# Patient Record
Sex: Male | Born: 1965
Health system: Southern US, Community
[De-identification: ages and names within clinical notes are randomized; demographics above are authoritative.]

## PROBLEM LIST (undated history)

## (undated) DIAGNOSIS — F329 Major depressive disorder, single episode, unspecified: Secondary | ICD-10-CM

## (undated) DIAGNOSIS — R079 Chest pain, unspecified: Secondary | ICD-10-CM

## (undated) DIAGNOSIS — I10 Essential (primary) hypertension: Secondary | ICD-10-CM

## (undated) DIAGNOSIS — K589 Irritable bowel syndrome without diarrhea: Secondary | ICD-10-CM

## (undated) DIAGNOSIS — R0609 Other forms of dyspnea: Secondary | ICD-10-CM

## (undated) DIAGNOSIS — J45909 Unspecified asthma, uncomplicated: Secondary | ICD-10-CM

## (undated) DIAGNOSIS — E785 Hyperlipidemia, unspecified: Secondary | ICD-10-CM

## (undated) DIAGNOSIS — E781 Pure hyperglyceridemia: Secondary | ICD-10-CM

## (undated) DIAGNOSIS — F445 Conversion disorder with seizures or convulsions: Secondary | ICD-10-CM

## (undated) DIAGNOSIS — G43909 Migraine, unspecified, not intractable, without status migrainosus: Secondary | ICD-10-CM

## (undated) DIAGNOSIS — F32A Depression, unspecified: Secondary | ICD-10-CM

## (undated) DIAGNOSIS — K5900 Constipation, unspecified: Secondary | ICD-10-CM

## (undated) DIAGNOSIS — E669 Obesity, unspecified: Secondary | ICD-10-CM

## (undated) DIAGNOSIS — M109 Gout, unspecified: Secondary | ICD-10-CM

## (undated) DIAGNOSIS — R0600 Dyspnea, unspecified: Secondary | ICD-10-CM

## (undated) DIAGNOSIS — Z8619 Personal history of other infectious and parasitic diseases: Secondary | ICD-10-CM

## (undated) DIAGNOSIS — F259 Schizoaffective disorder, unspecified: Secondary | ICD-10-CM

## (undated) DIAGNOSIS — Z8739 Personal history of other diseases of the musculoskeletal system and connective tissue: Secondary | ICD-10-CM

## (undated) HISTORY — DX: Personal history of other infectious and parasitic diseases: Z86.19

## (undated) HISTORY — DX: Dyspnea, unspecified: R06.00

## (undated) HISTORY — DX: Gout, unspecified: M10.9

## (undated) HISTORY — DX: Depression, unspecified: F32.A

## (undated) HISTORY — DX: Migraine, unspecified, not intractable, without status migrainosus: G43.909

## (undated) HISTORY — DX: Personal history of other diseases of the musculoskeletal system and connective tissue: Z87.39

## (undated) HISTORY — DX: Constipation, unspecified: K59.00

## (undated) HISTORY — DX: Obesity, unspecified: E66.9

## (undated) HISTORY — DX: Hyperlipidemia, unspecified: E78.5

## (undated) HISTORY — DX: Conversion disorder with seizures or convulsions: F44.5

## (undated) HISTORY — PX: UMBILICAL HERNIA REPAIR: SHX196

## (undated) HISTORY — DX: Chest pain, unspecified: R07.9

## (undated) HISTORY — DX: Schizoaffective disorder, unspecified: F25.9

## (undated) HISTORY — DX: Other forms of dyspnea: R06.09

## (undated) HISTORY — DX: Essential (primary) hypertension: I10

## (undated) HISTORY — DX: Pure hyperglyceridemia: E78.1

## (undated) HISTORY — DX: Irritable bowel syndrome without diarrhea: K58.9

---
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
## 2009-05-08 ENCOUNTER — Emergency Department (HOSPITAL_BASED_OUTPATIENT_CLINIC_OR_DEPARTMENT_OTHER): Admission: EM | Admit: 2009-05-08 | Discharge: 2009-05-08 | Payer: Self-pay | Admitting: Emergency Medicine

## 2011-03-06 ENCOUNTER — Other Ambulatory Visit: Payer: Self-pay | Admitting: Diagnostic Neuroimaging

## 2011-03-06 DIAGNOSIS — Z139 Encounter for screening, unspecified: Secondary | ICD-10-CM

## 2011-03-06 DIAGNOSIS — R413 Other amnesia: Secondary | ICD-10-CM

## 2011-03-06 DIAGNOSIS — R569 Unspecified convulsions: Secondary | ICD-10-CM

## 2011-03-10 ENCOUNTER — Ambulatory Visit
Admission: RE | Admit: 2011-03-10 | Discharge: 2011-03-10 | Disposition: A | Discharge status: Home / Self Care | Payer: Medicaid Other | Source: Ambulatory Visit | Attending: Diagnostic Neuroimaging | Admitting: Diagnostic Neuroimaging

## 2011-03-10 DIAGNOSIS — R413 Other amnesia: Secondary | ICD-10-CM

## 2011-03-10 DIAGNOSIS — Z139 Encounter for screening, unspecified: Secondary | ICD-10-CM

## 2011-03-10 DIAGNOSIS — R569 Unspecified convulsions: Secondary | ICD-10-CM

## 2011-03-10 MED ORDER — GADOBENATE DIMEGLUMINE 529 MG/ML IV SOLN
20.0000 mL | Freq: Once | INTRAVENOUS | Status: AC | PRN
  Administered 2011-03-10: 20 mL via INTRAVENOUS

## 2014-08-07 DIAGNOSIS — E669 Obesity, unspecified: Secondary | ICD-10-CM | POA: Diagnosis not present

## 2014-08-07 DIAGNOSIS — I10 Essential (primary) hypertension: Secondary | ICD-10-CM | POA: Diagnosis not present

## 2014-08-07 DIAGNOSIS — E785 Hyperlipidemia, unspecified: Secondary | ICD-10-CM | POA: Diagnosis not present

## 2014-08-07 DIAGNOSIS — F205 Residual schizophrenia: Secondary | ICD-10-CM | POA: Diagnosis not present

## 2014-10-01 DIAGNOSIS — F251 Schizoaffective disorder, depressive type: Secondary | ICD-10-CM | POA: Diagnosis not present

## 2015-01-21 DIAGNOSIS — F251 Schizoaffective disorder, depressive type: Secondary | ICD-10-CM | POA: Diagnosis not present

## 2015-05-03 DIAGNOSIS — R799 Abnormal finding of blood chemistry, unspecified: Secondary | ICD-10-CM | POA: Diagnosis not present

## 2015-05-03 DIAGNOSIS — F209 Schizophrenia, unspecified: Secondary | ICD-10-CM | POA: Diagnosis not present

## 2015-05-03 DIAGNOSIS — I10 Essential (primary) hypertension: Secondary | ICD-10-CM | POA: Diagnosis not present

## 2015-05-03 DIAGNOSIS — R5383 Other fatigue: Secondary | ICD-10-CM | POA: Diagnosis not present

## 2015-05-03 DIAGNOSIS — E785 Hyperlipidemia, unspecified: Secondary | ICD-10-CM | POA: Diagnosis not present

## 2015-05-03 DIAGNOSIS — E669 Obesity, unspecified: Secondary | ICD-10-CM | POA: Diagnosis not present

## 2015-05-22 DIAGNOSIS — F251 Schizoaffective disorder, depressive type: Secondary | ICD-10-CM | POA: Diagnosis not present

## 2015-09-11 DIAGNOSIS — F251 Schizoaffective disorder, depressive type: Secondary | ICD-10-CM | POA: Diagnosis not present

## 2015-12-30 DIAGNOSIS — F251 Schizoaffective disorder, depressive type: Secondary | ICD-10-CM | POA: Diagnosis not present

## 2016-02-05 DIAGNOSIS — E785 Hyperlipidemia, unspecified: Secondary | ICD-10-CM | POA: Diagnosis not present

## 2016-02-05 DIAGNOSIS — I10 Essential (primary) hypertension: Secondary | ICD-10-CM | POA: Diagnosis not present

## 2016-02-05 DIAGNOSIS — M549 Dorsalgia, unspecified: Secondary | ICD-10-CM | POA: Diagnosis not present

## 2016-03-25 DIAGNOSIS — Z23 Encounter for immunization: Secondary | ICD-10-CM | POA: Diagnosis not present

## 2016-03-25 DIAGNOSIS — S61230A Puncture wound without foreign body of right index finger without damage to nail, initial encounter: Secondary | ICD-10-CM | POA: Diagnosis not present

## 2016-04-08 DIAGNOSIS — I10 Essential (primary) hypertension: Secondary | ICD-10-CM | POA: Diagnosis not present

## 2016-04-08 DIAGNOSIS — Z125 Encounter for screening for malignant neoplasm of prostate: Secondary | ICD-10-CM | POA: Diagnosis not present

## 2016-04-08 DIAGNOSIS — R350 Frequency of micturition: Secondary | ICD-10-CM | POA: Diagnosis not present

## 2016-04-08 DIAGNOSIS — K047 Periapical abscess without sinus: Secondary | ICD-10-CM | POA: Diagnosis not present

## 2016-04-08 DIAGNOSIS — E785 Hyperlipidemia, unspecified: Secondary | ICD-10-CM | POA: Diagnosis not present

## 2016-04-08 DIAGNOSIS — Z23 Encounter for immunization: Secondary | ICD-10-CM | POA: Diagnosis not present

## 2016-04-20 DIAGNOSIS — F251 Schizoaffective disorder, depressive type: Secondary | ICD-10-CM | POA: Diagnosis not present

## 2016-06-04 DIAGNOSIS — M109 Gout, unspecified: Secondary | ICD-10-CM | POA: Diagnosis not present

## 2016-08-13 DIAGNOSIS — H52223 Regular astigmatism, bilateral: Secondary | ICD-10-CM | POA: Diagnosis not present

## 2016-09-29 DIAGNOSIS — I10 Essential (primary) hypertension: Secondary | ICD-10-CM | POA: Diagnosis not present

## 2016-09-29 DIAGNOSIS — R0609 Other forms of dyspnea: Secondary | ICD-10-CM | POA: Diagnosis not present

## 2016-09-29 DIAGNOSIS — E785 Hyperlipidemia, unspecified: Secondary | ICD-10-CM | POA: Diagnosis not present

## 2016-10-01 ENCOUNTER — Telehealth: Payer: Self-pay | Admitting: *Deleted

## 2016-10-01 NOTE — Telephone Encounter (Signed)
Notes sent to scheduling.   

## 2016-10-02 ENCOUNTER — Telehealth: Payer: Self-pay | Admitting: Cardiology

## 2016-10-02 NOTE — Telephone Encounter (Signed)
Received records from [facility] for appointment on 10/05/16 with Boyce Medici, PA-C.  Records put with Brittany's schedule for 10/05/16. lp

## 2016-10-05 ENCOUNTER — Ambulatory Visit (INDEPENDENT_AMBULATORY_CARE_PROVIDER_SITE_OTHER): Payer: PPO | Admitting: Cardiology

## 2016-10-05 ENCOUNTER — Encounter: Payer: Self-pay | Admitting: Cardiology

## 2016-10-05 VITALS — BP 132/88 | HR 79 | Ht 67.5 in | Wt 241.2 lb

## 2016-10-05 DIAGNOSIS — R0602 Shortness of breath: Secondary | ICD-10-CM | POA: Diagnosis not present

## 2016-10-05 DIAGNOSIS — R0609 Other forms of dyspnea: Secondary | ICD-10-CM | POA: Diagnosis not present

## 2016-10-05 DIAGNOSIS — R079 Chest pain, unspecified: Secondary | ICD-10-CM | POA: Diagnosis not present

## 2016-10-05 LAB — CBC WITH DIFFERENTIAL/PLATELET
Basophils Absolute: 77 cells/uL (ref 0–200)
Basophils Relative: 1 %
Eosinophils Absolute: 154 cells/uL (ref 15–500)
Eosinophils Relative: 2 %
HCT: 45.2 % (ref 38.5–50.0)
Hemoglobin: 15.3 g/dL (ref 13.2–17.1)
Lymphocytes Relative: 38 %
Lymphs Abs: 2926 cells/uL (ref 850–3900)
MCH: 29.1 pg (ref 27.0–33.0)
MCHC: 33.8 g/dL (ref 32.0–36.0)
MCV: 85.9 fL (ref 80.0–100.0)
MPV: 11.3 fL (ref 7.5–12.5)
Monocytes Absolute: 924 cells/uL (ref 200–950)
Monocytes Relative: 12 %
Neutro Abs: 3619 cells/uL (ref 1500–7800)
Neutrophils Relative %: 47 %
Platelets: 237 10*3/uL (ref 140–400)
RBC: 5.26 MIL/uL (ref 4.20–5.80)
RDW: 14 % (ref 11.0–15.0)
WBC: 7.7 10*3/uL (ref 3.8–10.8)

## 2016-10-05 NOTE — Patient Instructions (Addendum)
Schedule Lexiscan Myoview   Schedule Echocardiogram   Lab today ( cbc,bnp )    Your physician recommends that you schedule a follow-up appointment in: 2 weeks with extender on Dr.Crenshaw's team

## 2016-10-05 NOTE — Progress Notes (Signed)
10/05/2016 Carl Flynn   1965-09-05  [id number]  Primary Physician [facility] Primary Cardiologist: New (Dr. Jens Som, DOD)   Reason for Visit/CC: New Patient Evaluation for Dyspnea Referring MD: Lovenia Kim, PA-C, [facility]  HPI:  Carl Flynn is a 51 y.o. male who is being seen today, as a new patient, for the evaluation of exertional dyspnea at the request of Lovenia Kim, PA-C.  His cardiac risk factors include hypertension, hyperlipidemia/ hypertriglyceridemia and obesity.   For the last 6 months he has experience exertional dyspnea that occurs with minimal activity such as getting clothes out of the dryer, mopping the floors and climbing stairs. His symptoms resolve with rest. He also notes increased fatigue. He notes occasional CP, although not related to his dyspnea. Can occur at rest and feels like indigestion, although not related with meals.  Recent laboratory work at PCP office included a lipid panel as well as a comprehensive metabolic panel. Lipid panel showed elevated LDL at 124 mg/dL. Triglycerides were severely elevated at 316 mg/dL. HDL was 31. Comprehensive metabolic panel showed no normal serum creatinine at 1.09. Potassium normal at 4.0. Hepatic function also normal. No CBC was obtained.   EKG today demonstrates NSR. No ischemia. He is currently asymptomatic at rest. BP is 132/88.    Current Meds  Medication Sig  . lisinopril-hydrochlorothiazide (PRINZIDE,ZESTORETIC) 20-12.5 MG tablet Take by mouth daily.   . naproxen (NAPROSYN) 500 MG tablet Take by mouth daily.   . propranolol (INDERAL) 10 MG tablet Take by mouth 2 (two) times daily.   . simvastatin (ZOCOR) 20 MG tablet Take by mouth daily.   Marland Kitchen thiothixene (NAVANE) 2 MG capsule Take 2 mg by mouth daily.   Marland Kitchen topiramate (TOPAMAX) 100 MG tablet Take 100 mg by mouth daily.   . traZODone (DESYREL) 100 MG tablet Take 100 mg by mouth daily.   Marland Kitchen venlafaxine XR (EFFEXOR-XR) 150 MG 24  hr capsule Take 150 mg by mouth daily.    Allergies  Allergen Reactions  . Hydrocodone    Past Medical History:  Diagnosis Date  . Chest pain at rest   . Exertional dyspnea   . HLD (hyperlipidemia)   . HTN (hypertension)   . Hypertriglyceridemia   . Obesity    Family History  Problem Relation Age of Onset  . CAD Mother 45    CABG x 4   Past Surgical History:  Procedure Laterality Date  . UMBILICAL HERNIA REPAIR     Social History   Social History  . Marital status: Married    Spouse name: N/A  . Number of children: N/A  . Years of education: N/A   Occupational History  . Not on file.   Social History Main Topics  . Smoking status: Not on file  . Smokeless tobacco: Not on file  . Alcohol use Not on file  . Drug use: Unknown  . Sexual activity: Not on file   Other Topics Concern  . Not on file   Social History Narrative  . No narrative on file     Review of Systems: General: negative for chills, fever, night sweats or weight changes.  Cardiovascular: negative for chest pain, dyspnea on exertion, edema, orthopnea, palpitations, paroxysmal nocturnal dyspnea or shortness of breath Dermatological: negative for rash Respiratory: negative for cough or wheezing Urologic: negative for hematuria Abdominal: negative for nausea, vomiting, diarrhea, bright red blood per rectum, melena, or hematemesis Neurologic: negative for visual changes, syncope, or dizziness All  other systems reviewed and are otherwise negative except as noted above.   Physical Exam:  Blood pressure 132/88, pulse 79, height 5' 7.5" (1.715 m), weight 241 lb 3.2 oz (109.4 kg).  General appearance: alert, cooperative, no distress and moderately obese Neck: no carotid bruit and no JVD Lungs: clear to auscultation bilaterally Heart: regular rate and rhythm, S1, S2 normal, no murmur, click, rub or gallop Extremities: extremities normal, atraumatic, no cyanosis or edema Pulses: 2+ and  symmetric Skin: Skin color, texture, turgor normal. No rashes or lesions Neurologic: Grossly normal  EKG NSR. No ischemia  -- personally reviewed   ASSESSMENT AND PLAN:   1. Exertional Dyspnea/ Intermittent Chest Pain at Rest: EKG shows NSR w/o ischemia. I've discussed plan with Dr. Jens Som, DOD. Given his symptoms and risk factors, we will order a 2D Echo and Lexiscan NST to assess for structural heart disease/ r/o HF and coronary ischemia. We will also check a CBC today to r/o anemia. His PCP is managing his risk factors, which include HTN and HLD. F/u with APP on Dr. Ludwig Clarks team in 1-2 weeks.     Carl Flynn Delmer Islam, M[facility] 10/05/2016 2:40 PM

## 2016-10-06 LAB — BRAIN NATRIURETIC PEPTIDE: Brain Natriuretic Peptide: <4 pg/mL (ref <100)

## 2016-10-20 ENCOUNTER — Other Ambulatory Visit (HOSPITAL_COMMUNITY): Payer: PPO

## 2016-10-21 ENCOUNTER — Telehealth (HOSPITAL_COMMUNITY): Payer: Self-pay

## 2016-10-21 NOTE — Telephone Encounter (Signed)
Encounter complete. 

## 2016-10-23 ENCOUNTER — Ambulatory Visit (HOSPITAL_COMMUNITY)
Admission: RE | Admit: 2016-10-23 | Payer: PPO | Source: Ambulatory Visit | Attending: Cardiology | Admitting: Cardiology

## 2016-10-30 ENCOUNTER — Ambulatory Visit: Payer: PPO | Admitting: Cardiology

## 2016-11-02 DIAGNOSIS — J9801 Acute bronchospasm: Secondary | ICD-10-CM | POA: Diagnosis not present

## 2016-11-04 ENCOUNTER — Telehealth (HOSPITAL_COMMUNITY): Payer: Self-pay | Admitting: Cardiology

## 2016-11-04 NOTE — Telephone Encounter (Signed)
Called pt and spoke with him about rescheduling his echo and myoview . And he voiced that he was no completely over cold and is still having some breathing problems. He just rescheduled echo at this time.

## 2016-11-05 ENCOUNTER — Ambulatory Visit (HOSPITAL_COMMUNITY): Payer: PPO | Attending: Cardiology

## 2016-11-05 ENCOUNTER — Other Ambulatory Visit: Payer: Self-pay

## 2016-11-05 DIAGNOSIS — E785 Hyperlipidemia, unspecified: Secondary | ICD-10-CM | POA: Diagnosis not present

## 2016-11-05 DIAGNOSIS — R0602 Shortness of breath: Secondary | ICD-10-CM

## 2016-11-05 DIAGNOSIS — E669 Obesity, unspecified: Secondary | ICD-10-CM | POA: Diagnosis not present

## 2016-11-05 DIAGNOSIS — Z6837 Body mass index (BMI) 37.0-37.9, adult: Secondary | ICD-10-CM | POA: Diagnosis not present

## 2016-11-05 DIAGNOSIS — I10 Essential (primary) hypertension: Secondary | ICD-10-CM | POA: Insufficient documentation

## 2016-11-05 DIAGNOSIS — I340 Nonrheumatic mitral (valve) insufficiency: Secondary | ICD-10-CM | POA: Diagnosis not present

## 2016-11-05 DIAGNOSIS — I358 Other nonrheumatic aortic valve disorders: Secondary | ICD-10-CM | POA: Diagnosis not present

## 2016-12-04 DIAGNOSIS — F251 Schizoaffective disorder, depressive type: Secondary | ICD-10-CM | POA: Diagnosis not present

## 2016-12-09 ENCOUNTER — Telehealth (HOSPITAL_COMMUNITY): Payer: Self-pay | Admitting: Cardiology

## 2016-12-10 NOTE — Telephone Encounter (Signed)
12/09/2016 10:08 AM Phone (Outgoing) [name], Carl Flynn (Self) [phone] (H)   Completed - Called pt and spoke with him and he voiced that he was in the middle of something and wanted someone to call him back.     By Elita BooneGriffin, Jakelin Taussig A

## 2016-12-29 ENCOUNTER — Telehealth (HOSPITAL_COMMUNITY): Payer: Self-pay | Admitting: *Deleted

## 2016-12-29 NOTE — Telephone Encounter (Signed)
Patient given detailed instructions per Myocardial Perfusion Study Information Sheet for the test on 01/01/17 at 1100. Patient notified to arrive 15 minutes early and that it is imperative to arrive on time for appointment to keep from having the test rescheduled.  If you need to cancel or reschedule your appointment, please call the office within 24 hours of your appointment. . Patient verbalized understanding.Carl Flynn, Adelene IdlerCynthia W

## 2017-01-01 ENCOUNTER — Ambulatory Visit (HOSPITAL_COMMUNITY): Payer: PPO | Attending: Cardiovascular Disease

## 2017-01-01 DIAGNOSIS — R0602 Shortness of breath: Secondary | ICD-10-CM | POA: Insufficient documentation

## 2017-01-01 LAB — MYOCARDIAL PERFUSION IMAGING
CHL CUP RESTING HR STRESS: 66 {beats}/min
LV sys vol: 57 mL
LVDIAVOL: 123 mL (ref 62–150)
NUC STRESS TID: 0.97
Peak HR: 100 {beats}/min
RATE: 0.37
SDS: 1
SRS: 6
SSS: 7

## 2017-01-01 MED ORDER — REGADENOSON 0.4 MG/5ML IV SOLN
0.4000 mg | Freq: Once | INTRAVENOUS | Status: AC
  Administered 2017-01-01: 0.4 mg via INTRAVENOUS

## 2017-01-01 MED ORDER — TECHNETIUM TC 99M TETROFOSMIN IV KIT
32.5000 | PACK | Freq: Once | INTRAVENOUS | Status: AC | PRN
  Administered 2017-01-01: 32.5 via INTRAVENOUS
  Filled 2017-01-01: qty 33

## 2017-01-01 MED ORDER — TECHNETIUM TC 99M TETROFOSMIN IV KIT
10.5000 | PACK | Freq: Once | INTRAVENOUS | Status: AC | PRN
  Administered 2017-01-01: 10.5 via INTRAVENOUS
  Filled 2017-01-01: qty 11

## 2017-01-05 ENCOUNTER — Inpatient Hospital Stay (HOSPITAL_COMMUNITY): Admission: RE | Admit: 2017-01-05 | Payer: PPO | Source: Ambulatory Visit

## 2017-01-18 ENCOUNTER — Telehealth: Payer: Self-pay | Admitting: Cardiology

## 2017-01-18 NOTE — Telephone Encounter (Signed)
Notes recorded by Jeanann Lewandowsky, RMA on 01/04/2017 at 9:10 AM EDT Mickel Baas, Please verify f/u appt? Pt was supposed to be seen in May. If test is normal, does he need f/u appt? ------  Notes recorded by Isaiah Serge, NP on 01/04/2017 at 8:11 AM EDT Pt was seen at [facility], Nuc stress test looks good, low risk, but he was supposed to follow up with Dr. Stanford Breed or APP next week. Please arrange. Thank you.  Notified the pt of his nuclear stress test per Cecilie Kicks NP.  Pt verbalized understanding, but wanted to ask Mickel Baas or Dr. Stanford Breed where he should go from here?  Pt states that he is still having the same symptoms of DOE, and becoming easily fatigued. Pt would like to schedule a follow-up appt with either Mickel Baas or Dr. Stanford Breed.  Pt states he was wanting to see Dr. Stanford Breed if preferable, for he is assigned to the pt, and he has never met him.  Informed the pt that I will route this request to Cecilie Kicks, Dr. Stanford Breed, and covering RN for further review and follow-up with the pt.  Pt verbalized understanding and agrees with this plan.

## 2017-01-18 NOTE — Telephone Encounter (Signed)
New Message  Pt call requesting to speak with RN about stress test tests. Please call back to discuss

## 2017-01-19 NOTE — Telephone Encounter (Signed)
Yes pt needs follow up with Dr. Jens Somrenshaw or his APP.  Please arrange

## 2017-01-19 NOTE — Telephone Encounter (Signed)
Will route this message to Dr. Ludwig Clarksrenshaw's scheduler Cleotis NipperLashonda Shobowale, to follow-up with the pt, and schedule and appt with Dr. Jens Somrenshaw or an APP on his team.

## 2017-01-23 NOTE — Telephone Encounter (Signed)
Schedule fuov Brian Crenshaw  

## 2017-01-26 NOTE — Telephone Encounter (Signed)
Follow up scheduled 02-13-17.

## 2017-02-03 ENCOUNTER — Ambulatory Visit (INDEPENDENT_AMBULATORY_CARE_PROVIDER_SITE_OTHER): Payer: PPO | Admitting: Physician Assistant

## 2017-02-03 ENCOUNTER — Encounter: Payer: Self-pay | Admitting: Physician Assistant

## 2017-02-03 VITALS — BP 116/74 | HR 70 | Ht 67.0 in | Wt 253.0 lb

## 2017-02-03 DIAGNOSIS — R0789 Other chest pain: Secondary | ICD-10-CM | POA: Diagnosis not present

## 2017-02-03 DIAGNOSIS — R0609 Other forms of dyspnea: Secondary | ICD-10-CM

## 2017-02-03 DIAGNOSIS — I10 Essential (primary) hypertension: Secondary | ICD-10-CM

## 2017-02-03 DIAGNOSIS — E785 Hyperlipidemia, unspecified: Secondary | ICD-10-CM | POA: Diagnosis not present

## 2017-02-03 NOTE — Patient Instructions (Signed)
Medication Instructions:   No changes  Labwork:   None  Testing/Procedures:  None  Follow-Up:  With Dr. Jens Somrenshaw in 3 months   If you need a refill on your cardiac medications before your next appointment, please call your pharmacy.

## 2017-02-03 NOTE — Progress Notes (Signed)
Cardiology Office Note    Date:  02/05/2017   ID:  Carl Flynn, DOB 03/21/1966, MRN [id number]  PCP:  Trey SailorsPa, [facility]  Cardiologist:  Dr. Jens Somrenshaw (pending 1st visit with Dr. Jens Somrenshaw)  Chief Complaint  Patient presents with  . Follow-up    2 weeks. Atypical chest pain and DOE    History of Present Illness:  Carl Flynn is a 51 y.o. male with PMH of HTN, HLD and obesity. He was seen as a new patient by [employer] CorporationBrittainey Simmon on 10/05/2016. Apparently he had exertional dyspnea for 6 month prior to the last visit with minimal activity. Last lipid panel showed LDL 124 and triglycerides 782216. Echocardiogram obtained on 11/05/2016 showed EF 50-55%, mild MR, increased thickening of the atrial septum consistent with lipomatous hypertrophy. He eventually underwent Myoview on 01/01/2017 which showed EF 54%, probable normal perfusion and mild soft tissue attenuation, no significant ischemia or scar. He was supposed to follow-up back in May, however due to the delay in stress test, his follow-up was also delayed.  He recently contact cardiology service due to persistent symptom. He presents today still complaining of dyspnea on exertion. He has a bad back and bad knees and does not do any exertional type activity. He will occasionally have some chest tightness, but always occurs at rest and never with exertion. It only last a few seconds at a time before going away. I don't think his chest discomfort is cardiac in nature. Given recent normal echocardiogram and normal Myoview, I would recommend increased exercise. Given the bad back and bad knee, he probably will benefit from water aerobics. At this point I do not recommend any additional workup.    Past Medical History:  Diagnosis Date  . Chest pain at rest   . Exertional dyspnea   . HLD (hyperlipidemia)   . HTN (hypertension)   . Hypertriglyceridemia   . Obesity     Past Surgical History:  Procedure Laterality Date  .  UMBILICAL HERNIA REPAIR      Current Medications: Outpatient Medications Prior to Visit  Medication Sig Dispense Refill  . lisinopril-hydrochlorothiazide (PRINZIDE,ZESTORETIC) 20-12.5 MG tablet Take by mouth daily.     . naproxen (NAPROSYN) 500 MG tablet Take by mouth daily.     . propranolol (INDERAL) 10 MG tablet Take by mouth 2 (two) times daily.     . simvastatin (ZOCOR) 20 MG tablet Take by mouth daily.     Marland Kitchen. thiothixene (NAVANE) 2 MG capsule Take 2 mg by mouth daily.     Marland Kitchen. topiramate (TOPAMAX) 100 MG tablet Take 100 mg by mouth daily.     . traZODone (DESYREL) 100 MG tablet Take 100 mg by mouth daily.     Marland Kitchen. venlafaxine XR (EFFEXOR-XR) 150 MG 24 hr capsule Take 150 mg by mouth daily.      No facility-administered medications prior to visit.      Allergies:   Hydrocodone   Social History   Social History  . Marital status: Married    Spouse name: N/A  . Number of children: N/A  . Years of education: N/A   Social History Main Topics  . Smoking status: Former Games developermoker  . Smokeless tobacco: Former NeurosurgeonUser  . Alcohol use None  . Drug use: Unknown  . Sexual activity: Not Asked   Other Topics Concern  . None   Social History Narrative  . None     Family History:  The patient's family history includes CAD (age  of onset: 65) in his mother.   ROS:   Please see the history of present illness.    ROS All other systems reviewed and are negative.   PHYSICAL EXAM:   VS:  BP 116/74   Pulse 70   Ht 5\' 7"  (1.702 m)   Wt 253 lb (114.8 kg)   BMI 39.63 kg/m    GEN: Well nourished, well developed, in no acute distress  HEENT: normal  Neck: no JVD, carotid bruits, or masses Cardiac: RRR; no murmurs, rubs, or gallops,no edema  Respiratory:  clear to auscultation bilaterally, normal work of breathing GI: soft, nontender, nondistended, + BS MS: no deformity or atrophy  Skin: warm and dry, no rash Neuro:  Alert and Oriented x 3, Strength and sensation are intact Psych: euthymic  mood, full affect  Wt Readings from Last 3 Encounters:  02/03/17 253 lb (114.8 kg)  10/05/16 241 lb 3.2 oz (109.4 kg)      Studies/Labs Reviewed:   EKG:  EKG is not ordered today.    Recent Labs: 10/05/2016: Brain Natriuretic Peptide <4.0; Hemoglobin 15.3; Platelets 237   Lipid Panel No results found for: CHOL, TRIG, HDL, CHOLHDL, VLDL, LDLCALC, LDLDIRECT  Additional studies/ records that were reviewed today include:   Echo 11/05/2016 LV EF: 50% -   55%  Study Conclusions  - Left ventricle: The cavity size was normal. Systolic function was   normal. The estimated ejection fraction was in the range of 50%   to 55%. Wall motion was normal; there were no regional wall   motion abnormalities. The transmitral flow pattern was normal.   Left ventricular diastolic function parameters were normal. - Aortic valve: Trileaflet; mildly thickened, mildly calcified   leaflets. - Mitral valve: There was mild regurgitation. - Left atrium: The atrium was mildly dilated. - Atrial septum: There was increased thickness of the septum,   consistent with lipomatous hypertrophy. - Tricuspid valve: There was trivial regurgitation.   Myoview 01/01/2017 Study Highlights    Nuclear stress EF: 54%.  Probable normal perfusion and mild soft tissue attenuation. No significant ischemia or scar  This is a low risk study.       ASSESSMENT:    1. Atypical chest pain   2. DOE (dyspnea on exertion)   3. Essential hypertension   4. Hyperlipidemia, unspecified hyperlipidemia type      PLAN:  In order of problems listed above:  1. Atypical chest pain: Last a few seconds at a time, never with exertion. Recent Myoview negative, I do not recommend any further workup.  2. Dyspnea on exertion: Given negative Myoview recently, and a very atypical chest pain, I would recommend increased exercise. He does have significant obesity and because of a bad back and bad knees, he does not exercise. Recent  echocardiogram showed normal ejection fraction  3. Hypertension: On lisinopril-hydrochlorothiazide and propanolol, blood pressure well controlled.  4. Hyperlipidemia: Continue Zocor 20 mg daily    Medication Adjustments/Labs and Tests Ordered: Current medicines are reviewed at length with the patient today.  Concerns regarding medicines are outlined above.  Medication changes, Labs and Tests ordered today are listed in the Patient Instructions below. Patient Instructions  Medication Instructions:   No changes  Labwork:   None  Testing/Procedures:  None  Follow-Up:  With Dr. Jens Som in 3 months   If you need a refill on your cardiac medications before your next appointment, please call your pharmacy.      Signed, Azalee Course, PA  02/05/2017 1:49 PM    [facility] [address] [place], [place], [place]  [address] Phone: [phone]; Fax: [phone]

## 2017-02-05 ENCOUNTER — Encounter: Payer: Self-pay | Admitting: Physician Assistant

## 2017-03-30 DIAGNOSIS — I10 Essential (primary) hypertension: Secondary | ICD-10-CM | POA: Diagnosis not present

## 2017-03-30 DIAGNOSIS — E785 Hyperlipidemia, unspecified: Secondary | ICD-10-CM | POA: Diagnosis not present

## 2017-03-30 DIAGNOSIS — M545 Low back pain: Secondary | ICD-10-CM | POA: Diagnosis not present

## 2017-03-30 DIAGNOSIS — K5900 Constipation, unspecified: Secondary | ICD-10-CM | POA: Diagnosis not present

## 2017-04-08 DIAGNOSIS — M545 Low back pain: Secondary | ICD-10-CM | POA: Diagnosis not present

## 2017-04-30 ENCOUNTER — Encounter: Payer: Self-pay | Admitting: Cardiology

## 2017-05-06 NOTE — Progress Notes (Deleted)
      HPI: Follow-up dyspnea and CP; echocardiogram May 2018 showed normal LV function, mild mitral regurgitation and mild left atrial enlargement. Nuclear study July 2018 showed ejection fraction 54% and no ischemia or infarction. Since last seen,   Current Outpatient Medications  Medication Sig Dispense Refill  . lisinopril-hydrochlorothiazide (PRINZIDE,ZESTORETIC) 20-12.5 MG tablet Take by mouth daily.     . naproxen (NAPROSYN) 500 MG tablet Take by mouth daily.     . propranolol (INDERAL) 10 MG tablet Take by mouth 2 (two) times daily.     . simvastatin (ZOCOR) 20 MG tablet Take by mouth daily.     Marland Kitchen. thiothixene (NAVANE) 2 MG capsule Take 2 mg by mouth daily.     Marland Kitchen. topiramate (TOPAMAX) 100 MG tablet Take 100 mg by mouth daily.     . traZODone (DESYREL) 100 MG tablet Take 100 mg by mouth daily.     Marland Kitchen. venlafaxine XR (EFFEXOR-XR) 150 MG 24 hr capsule Take 150 mg by mouth daily.      No current facility-administered medications for this visit.      Past Medical History:  Diagnosis Date  . Chest pain at rest   . Exertional dyspnea   . HLD (hyperlipidemia)   . HTN (hypertension)   . Hypertriglyceridemia   . Obesity     Past Surgical History:  Procedure Laterality Date  . UMBILICAL HERNIA REPAIR      Social History   Socioeconomic History  . Marital status: Married    Spouse name: Not on file  . Number of children: Not on file  . Years of education: Not on file  . Highest education level: Not on file  Social Needs  . Financial resource strain: Not on file  . Food insecurity - worry: Not on file  . Food insecurity - inability: Not on file  . Transportation needs - medical: Not on file  . Transportation needs - non-medical: Not on file  Occupational History  . Not on file  Tobacco Use  . Smoking status: Former Games developermoker  . Smokeless tobacco: Former Engineer, waterUser  Substance and Sexual Activity  . Alcohol use: No  . Drug use: No  . Sexual activity: Not on file  Other Topics  Concern  . Not on file  Social History Narrative  . Not on file    Family History  Problem Relation Age of Onset  . CAD Mother 3680       CABG x 4    ROS: no fevers or chills, productive cough, hemoptysis, dysphasia, odynophagia, melena, hematochezia, dysuria, hematuria, rash, seizure activity, orthopnea, PND, pedal edema, claudication. Remaining systems are negative.  Physical Exam: Well-developed well-nourished in no acute distress.  Skin is warm and dry.  HEENT is normal.  Neck is supple.  Chest is clear to auscultation with normal expansion.  Cardiovascular exam is regular rate and rhythm.  Abdominal exam nontender or distended. No masses palpated. Extremities show no edema. neuro grossly intact  ECG- personally reviewed  A/P  1  Olga MillersBrian Lydie Stammen, MD

## 2017-05-14 ENCOUNTER — Ambulatory Visit: Payer: PPO | Admitting: Cardiology

## 2017-08-04 ENCOUNTER — Other Ambulatory Visit: Payer: Self-pay | Admitting: Internal Medicine

## 2017-08-04 DIAGNOSIS — R109 Unspecified abdominal pain: Secondary | ICD-10-CM

## 2017-08-13 ENCOUNTER — Ambulatory Visit
Admission: RE | Admit: 2017-08-13 | Discharge: 2017-08-13 | Disposition: A | Discharge status: Home / Self Care | Payer: PPO | Source: Ambulatory Visit | Attending: Internal Medicine | Admitting: Internal Medicine

## 2017-08-13 DIAGNOSIS — R109 Unspecified abdominal pain: Secondary | ICD-10-CM

## 2017-10-04 DIAGNOSIS — I10 Essential (primary) hypertension: Secondary | ICD-10-CM | POA: Diagnosis not present

## 2017-10-04 DIAGNOSIS — R350 Frequency of micturition: Secondary | ICD-10-CM | POA: Diagnosis not present

## 2017-10-04 DIAGNOSIS — E785 Hyperlipidemia, unspecified: Secondary | ICD-10-CM | POA: Diagnosis not present

## 2017-10-04 DIAGNOSIS — K5900 Constipation, unspecified: Secondary | ICD-10-CM | POA: Diagnosis not present

## 2017-10-11 DIAGNOSIS — F29 Unspecified psychosis not due to a substance or known physiological condition: Secondary | ICD-10-CM | POA: Diagnosis not present

## 2017-11-08 DIAGNOSIS — F29 Unspecified psychosis not due to a substance or known physiological condition: Secondary | ICD-10-CM | POA: Diagnosis not present

## 2017-11-15 DIAGNOSIS — M531 Cervicobrachial syndrome: Secondary | ICD-10-CM | POA: Diagnosis not present

## 2017-11-15 DIAGNOSIS — M9903 Segmental and somatic dysfunction of lumbar region: Secondary | ICD-10-CM | POA: Diagnosis not present

## 2017-11-15 DIAGNOSIS — M9901 Segmental and somatic dysfunction of cervical region: Secondary | ICD-10-CM | POA: Diagnosis not present

## 2017-11-15 DIAGNOSIS — M9905 Segmental and somatic dysfunction of pelvic region: Secondary | ICD-10-CM | POA: Diagnosis not present

## 2017-11-17 DIAGNOSIS — M9905 Segmental and somatic dysfunction of pelvic region: Secondary | ICD-10-CM | POA: Diagnosis not present

## 2017-11-17 DIAGNOSIS — M9901 Segmental and somatic dysfunction of cervical region: Secondary | ICD-10-CM | POA: Diagnosis not present

## 2017-11-17 DIAGNOSIS — M9903 Segmental and somatic dysfunction of lumbar region: Secondary | ICD-10-CM | POA: Diagnosis not present

## 2017-11-17 DIAGNOSIS — M531 Cervicobrachial syndrome: Secondary | ICD-10-CM | POA: Diagnosis not present

## 2017-11-18 DIAGNOSIS — M9901 Segmental and somatic dysfunction of cervical region: Secondary | ICD-10-CM | POA: Diagnosis not present

## 2017-11-18 DIAGNOSIS — M531 Cervicobrachial syndrome: Secondary | ICD-10-CM | POA: Diagnosis not present

## 2017-11-18 DIAGNOSIS — M9903 Segmental and somatic dysfunction of lumbar region: Secondary | ICD-10-CM | POA: Diagnosis not present

## 2017-11-18 DIAGNOSIS — M9905 Segmental and somatic dysfunction of pelvic region: Secondary | ICD-10-CM | POA: Diagnosis not present

## 2017-11-24 DIAGNOSIS — M531 Cervicobrachial syndrome: Secondary | ICD-10-CM | POA: Diagnosis not present

## 2017-11-24 DIAGNOSIS — M9905 Segmental and somatic dysfunction of pelvic region: Secondary | ICD-10-CM | POA: Diagnosis not present

## 2017-11-24 DIAGNOSIS — M9901 Segmental and somatic dysfunction of cervical region: Secondary | ICD-10-CM | POA: Diagnosis not present

## 2017-11-24 DIAGNOSIS — M9903 Segmental and somatic dysfunction of lumbar region: Secondary | ICD-10-CM | POA: Diagnosis not present

## 2017-11-26 DIAGNOSIS — M531 Cervicobrachial syndrome: Secondary | ICD-10-CM | POA: Diagnosis not present

## 2017-11-26 DIAGNOSIS — M9901 Segmental and somatic dysfunction of cervical region: Secondary | ICD-10-CM | POA: Diagnosis not present

## 2017-11-26 DIAGNOSIS — M9903 Segmental and somatic dysfunction of lumbar region: Secondary | ICD-10-CM | POA: Diagnosis not present

## 2017-11-26 DIAGNOSIS — M9905 Segmental and somatic dysfunction of pelvic region: Secondary | ICD-10-CM | POA: Diagnosis not present

## 2017-11-29 DIAGNOSIS — M9903 Segmental and somatic dysfunction of lumbar region: Secondary | ICD-10-CM | POA: Diagnosis not present

## 2017-11-29 DIAGNOSIS — M9901 Segmental and somatic dysfunction of cervical region: Secondary | ICD-10-CM | POA: Diagnosis not present

## 2017-11-29 DIAGNOSIS — M9905 Segmental and somatic dysfunction of pelvic region: Secondary | ICD-10-CM | POA: Diagnosis not present

## 2017-11-29 DIAGNOSIS — M531 Cervicobrachial syndrome: Secondary | ICD-10-CM | POA: Diagnosis not present

## 2017-12-01 DIAGNOSIS — M9903 Segmental and somatic dysfunction of lumbar region: Secondary | ICD-10-CM | POA: Diagnosis not present

## 2017-12-01 DIAGNOSIS — M531 Cervicobrachial syndrome: Secondary | ICD-10-CM | POA: Diagnosis not present

## 2017-12-01 DIAGNOSIS — M9901 Segmental and somatic dysfunction of cervical region: Secondary | ICD-10-CM | POA: Diagnosis not present

## 2017-12-01 DIAGNOSIS — M9905 Segmental and somatic dysfunction of pelvic region: Secondary | ICD-10-CM | POA: Diagnosis not present

## 2017-12-06 DIAGNOSIS — M531 Cervicobrachial syndrome: Secondary | ICD-10-CM | POA: Diagnosis not present

## 2017-12-06 DIAGNOSIS — M9903 Segmental and somatic dysfunction of lumbar region: Secondary | ICD-10-CM | POA: Diagnosis not present

## 2017-12-06 DIAGNOSIS — M9905 Segmental and somatic dysfunction of pelvic region: Secondary | ICD-10-CM | POA: Diagnosis not present

## 2017-12-06 DIAGNOSIS — M9901 Segmental and somatic dysfunction of cervical region: Secondary | ICD-10-CM | POA: Diagnosis not present

## 2017-12-15 DIAGNOSIS — M9905 Segmental and somatic dysfunction of pelvic region: Secondary | ICD-10-CM | POA: Diagnosis not present

## 2017-12-15 DIAGNOSIS — M9901 Segmental and somatic dysfunction of cervical region: Secondary | ICD-10-CM | POA: Diagnosis not present

## 2017-12-15 DIAGNOSIS — M531 Cervicobrachial syndrome: Secondary | ICD-10-CM | POA: Diagnosis not present

## 2017-12-15 DIAGNOSIS — M9903 Segmental and somatic dysfunction of lumbar region: Secondary | ICD-10-CM | POA: Diagnosis not present

## 2017-12-22 DIAGNOSIS — M531 Cervicobrachial syndrome: Secondary | ICD-10-CM | POA: Diagnosis not present

## 2017-12-22 DIAGNOSIS — M9905 Segmental and somatic dysfunction of pelvic region: Secondary | ICD-10-CM | POA: Diagnosis not present

## 2017-12-22 DIAGNOSIS — M9901 Segmental and somatic dysfunction of cervical region: Secondary | ICD-10-CM | POA: Diagnosis not present

## 2017-12-22 DIAGNOSIS — M9903 Segmental and somatic dysfunction of lumbar region: Secondary | ICD-10-CM | POA: Diagnosis not present

## 2017-12-31 DIAGNOSIS — H40033 Anatomical narrow angle, bilateral: Secondary | ICD-10-CM | POA: Diagnosis not present

## 2017-12-31 DIAGNOSIS — H04123 Dry eye syndrome of bilateral lacrimal glands: Secondary | ICD-10-CM | POA: Diagnosis not present

## 2018-01-05 DIAGNOSIS — M9903 Segmental and somatic dysfunction of lumbar region: Secondary | ICD-10-CM | POA: Diagnosis not present

## 2018-01-05 DIAGNOSIS — M531 Cervicobrachial syndrome: Secondary | ICD-10-CM | POA: Diagnosis not present

## 2018-01-05 DIAGNOSIS — M9905 Segmental and somatic dysfunction of pelvic region: Secondary | ICD-10-CM | POA: Diagnosis not present

## 2018-01-05 DIAGNOSIS — M9901 Segmental and somatic dysfunction of cervical region: Secondary | ICD-10-CM | POA: Diagnosis not present

## 2018-01-14 DIAGNOSIS — F603 Borderline personality disorder: Secondary | ICD-10-CM | POA: Diagnosis not present

## 2018-01-14 DIAGNOSIS — F251 Schizoaffective disorder, depressive type: Secondary | ICD-10-CM | POA: Diagnosis not present

## 2018-01-19 DIAGNOSIS — M9901 Segmental and somatic dysfunction of cervical region: Secondary | ICD-10-CM | POA: Diagnosis not present

## 2018-01-19 DIAGNOSIS — M531 Cervicobrachial syndrome: Secondary | ICD-10-CM | POA: Diagnosis not present

## 2018-01-19 DIAGNOSIS — M9903 Segmental and somatic dysfunction of lumbar region: Secondary | ICD-10-CM | POA: Diagnosis not present

## 2018-01-19 DIAGNOSIS — M9905 Segmental and somatic dysfunction of pelvic region: Secondary | ICD-10-CM | POA: Diagnosis not present

## 2018-02-03 DIAGNOSIS — M9901 Segmental and somatic dysfunction of cervical region: Secondary | ICD-10-CM | POA: Diagnosis not present

## 2018-02-03 DIAGNOSIS — M9905 Segmental and somatic dysfunction of pelvic region: Secondary | ICD-10-CM | POA: Diagnosis not present

## 2018-02-03 DIAGNOSIS — M531 Cervicobrachial syndrome: Secondary | ICD-10-CM | POA: Diagnosis not present

## 2018-02-03 DIAGNOSIS — M9903 Segmental and somatic dysfunction of lumbar region: Secondary | ICD-10-CM | POA: Diagnosis not present

## 2018-02-21 DIAGNOSIS — F251 Schizoaffective disorder, depressive type: Secondary | ICD-10-CM | POA: Diagnosis not present

## 2018-02-21 DIAGNOSIS — F603 Borderline personality disorder: Secondary | ICD-10-CM | POA: Diagnosis not present

## 2018-02-23 DIAGNOSIS — M9905 Segmental and somatic dysfunction of pelvic region: Secondary | ICD-10-CM | POA: Diagnosis not present

## 2018-02-23 DIAGNOSIS — M9903 Segmental and somatic dysfunction of lumbar region: Secondary | ICD-10-CM | POA: Diagnosis not present

## 2018-02-23 DIAGNOSIS — M531 Cervicobrachial syndrome: Secondary | ICD-10-CM | POA: Diagnosis not present

## 2018-02-23 DIAGNOSIS — M9901 Segmental and somatic dysfunction of cervical region: Secondary | ICD-10-CM | POA: Diagnosis not present

## 2018-03-16 DIAGNOSIS — M9903 Segmental and somatic dysfunction of lumbar region: Secondary | ICD-10-CM | POA: Diagnosis not present

## 2018-03-16 DIAGNOSIS — M9905 Segmental and somatic dysfunction of pelvic region: Secondary | ICD-10-CM | POA: Diagnosis not present

## 2018-03-16 DIAGNOSIS — M531 Cervicobrachial syndrome: Secondary | ICD-10-CM | POA: Diagnosis not present

## 2018-03-16 DIAGNOSIS — M9901 Segmental and somatic dysfunction of cervical region: Secondary | ICD-10-CM | POA: Diagnosis not present

## 2018-04-05 DIAGNOSIS — I10 Essential (primary) hypertension: Secondary | ICD-10-CM | POA: Diagnosis not present

## 2018-04-05 DIAGNOSIS — E785 Hyperlipidemia, unspecified: Secondary | ICD-10-CM | POA: Diagnosis not present

## 2018-04-07 DIAGNOSIS — M9901 Segmental and somatic dysfunction of cervical region: Secondary | ICD-10-CM | POA: Diagnosis not present

## 2018-04-07 DIAGNOSIS — I10 Essential (primary) hypertension: Secondary | ICD-10-CM | POA: Diagnosis not present

## 2018-04-07 DIAGNOSIS — M531 Cervicobrachial syndrome: Secondary | ICD-10-CM | POA: Diagnosis not present

## 2018-04-07 DIAGNOSIS — M9903 Segmental and somatic dysfunction of lumbar region: Secondary | ICD-10-CM | POA: Diagnosis not present

## 2018-04-07 DIAGNOSIS — Z23 Encounter for immunization: Secondary | ICD-10-CM | POA: Diagnosis not present

## 2018-04-07 DIAGNOSIS — E785 Hyperlipidemia, unspecified: Secondary | ICD-10-CM | POA: Diagnosis not present

## 2018-04-07 DIAGNOSIS — M9905 Segmental and somatic dysfunction of pelvic region: Secondary | ICD-10-CM | POA: Diagnosis not present

## 2018-05-02 DIAGNOSIS — R350 Frequency of micturition: Secondary | ICD-10-CM | POA: Diagnosis not present

## 2018-05-02 DIAGNOSIS — Z1211 Encounter for screening for malignant neoplasm of colon: Secondary | ICD-10-CM | POA: Diagnosis not present

## 2018-05-02 DIAGNOSIS — Z125 Encounter for screening for malignant neoplasm of prostate: Secondary | ICD-10-CM | POA: Diagnosis not present

## 2018-05-05 DIAGNOSIS — M9905 Segmental and somatic dysfunction of pelvic region: Secondary | ICD-10-CM | POA: Diagnosis not present

## 2018-05-05 DIAGNOSIS — M9903 Segmental and somatic dysfunction of lumbar region: Secondary | ICD-10-CM | POA: Diagnosis not present

## 2018-05-05 DIAGNOSIS — M9901 Segmental and somatic dysfunction of cervical region: Secondary | ICD-10-CM | POA: Diagnosis not present

## 2018-05-05 DIAGNOSIS — M531 Cervicobrachial syndrome: Secondary | ICD-10-CM | POA: Diagnosis not present

## 2018-06-02 DIAGNOSIS — M531 Cervicobrachial syndrome: Secondary | ICD-10-CM | POA: Diagnosis not present

## 2018-06-02 DIAGNOSIS — M9905 Segmental and somatic dysfunction of pelvic region: Secondary | ICD-10-CM | POA: Diagnosis not present

## 2018-06-02 DIAGNOSIS — M9901 Segmental and somatic dysfunction of cervical region: Secondary | ICD-10-CM | POA: Diagnosis not present

## 2018-06-02 DIAGNOSIS — M9903 Segmental and somatic dysfunction of lumbar region: Secondary | ICD-10-CM | POA: Diagnosis not present

## 2018-06-07 DIAGNOSIS — K589 Irritable bowel syndrome without diarrhea: Secondary | ICD-10-CM | POA: Diagnosis not present

## 2018-06-07 DIAGNOSIS — Z1211 Encounter for screening for malignant neoplasm of colon: Secondary | ICD-10-CM | POA: Diagnosis not present

## 2018-06-30 DIAGNOSIS — M9905 Segmental and somatic dysfunction of pelvic region: Secondary | ICD-10-CM | POA: Diagnosis not present

## 2018-06-30 DIAGNOSIS — M9901 Segmental and somatic dysfunction of cervical region: Secondary | ICD-10-CM | POA: Diagnosis not present

## 2018-06-30 DIAGNOSIS — M531 Cervicobrachial syndrome: Secondary | ICD-10-CM | POA: Diagnosis not present

## 2018-06-30 DIAGNOSIS — M9903 Segmental and somatic dysfunction of lumbar region: Secondary | ICD-10-CM | POA: Diagnosis not present

## 2018-07-05 DIAGNOSIS — Z1211 Encounter for screening for malignant neoplasm of colon: Secondary | ICD-10-CM | POA: Diagnosis not present

## 2018-07-18 DIAGNOSIS — L03211 Cellulitis of face: Secondary | ICD-10-CM | POA: Diagnosis not present

## 2018-07-28 DIAGNOSIS — M9905 Segmental and somatic dysfunction of pelvic region: Secondary | ICD-10-CM | POA: Diagnosis not present

## 2018-07-28 DIAGNOSIS — M9903 Segmental and somatic dysfunction of lumbar region: Secondary | ICD-10-CM | POA: Diagnosis not present

## 2018-07-28 DIAGNOSIS — M531 Cervicobrachial syndrome: Secondary | ICD-10-CM | POA: Diagnosis not present

## 2018-07-28 DIAGNOSIS — M9901 Segmental and somatic dysfunction of cervical region: Secondary | ICD-10-CM | POA: Diagnosis not present

## 2018-07-29 DIAGNOSIS — L03211 Cellulitis of face: Secondary | ICD-10-CM | POA: Diagnosis not present

## 2018-07-29 DIAGNOSIS — K209 Esophagitis, unspecified: Secondary | ICD-10-CM | POA: Diagnosis not present

## 2018-08-05 DIAGNOSIS — L03211 Cellulitis of face: Secondary | ICD-10-CM | POA: Diagnosis not present

## 2018-08-05 DIAGNOSIS — K0889 Other specified disorders of teeth and supporting structures: Secondary | ICD-10-CM | POA: Diagnosis not present

## 2018-08-05 DIAGNOSIS — K047 Periapical abscess without sinus: Secondary | ICD-10-CM | POA: Diagnosis not present

## 2018-08-25 DIAGNOSIS — M9905 Segmental and somatic dysfunction of pelvic region: Secondary | ICD-10-CM | POA: Diagnosis not present

## 2018-08-25 DIAGNOSIS — M9901 Segmental and somatic dysfunction of cervical region: Secondary | ICD-10-CM | POA: Diagnosis not present

## 2018-08-25 DIAGNOSIS — M531 Cervicobrachial syndrome: Secondary | ICD-10-CM | POA: Diagnosis not present

## 2018-08-25 DIAGNOSIS — M9903 Segmental and somatic dysfunction of lumbar region: Secondary | ICD-10-CM | POA: Diagnosis not present

## 2018-09-07 DIAGNOSIS — H10501 Unspecified blepharoconjunctivitis, right eye: Secondary | ICD-10-CM | POA: Diagnosis not present

## 2018-09-22 DIAGNOSIS — M531 Cervicobrachial syndrome: Secondary | ICD-10-CM | POA: Diagnosis not present

## 2018-09-22 DIAGNOSIS — M9901 Segmental and somatic dysfunction of cervical region: Secondary | ICD-10-CM | POA: Diagnosis not present

## 2018-09-22 DIAGNOSIS — M9903 Segmental and somatic dysfunction of lumbar region: Secondary | ICD-10-CM | POA: Diagnosis not present

## 2018-09-22 DIAGNOSIS — M9905 Segmental and somatic dysfunction of pelvic region: Secondary | ICD-10-CM | POA: Diagnosis not present

## 2018-10-06 DIAGNOSIS — J9801 Acute bronchospasm: Secondary | ICD-10-CM | POA: Diagnosis not present

## 2018-10-06 DIAGNOSIS — E785 Hyperlipidemia, unspecified: Secondary | ICD-10-CM | POA: Diagnosis not present

## 2018-10-06 DIAGNOSIS — M549 Dorsalgia, unspecified: Secondary | ICD-10-CM | POA: Diagnosis not present

## 2018-10-06 DIAGNOSIS — I10 Essential (primary) hypertension: Secondary | ICD-10-CM | POA: Diagnosis not present

## 2018-10-19 DIAGNOSIS — M9905 Segmental and somatic dysfunction of pelvic region: Secondary | ICD-10-CM | POA: Diagnosis not present

## 2018-10-19 DIAGNOSIS — M531 Cervicobrachial syndrome: Secondary | ICD-10-CM | POA: Diagnosis not present

## 2018-10-19 DIAGNOSIS — M9903 Segmental and somatic dysfunction of lumbar region: Secondary | ICD-10-CM | POA: Diagnosis not present

## 2018-10-19 DIAGNOSIS — M9901 Segmental and somatic dysfunction of cervical region: Secondary | ICD-10-CM | POA: Diagnosis not present

## 2018-10-29 IMAGING — US US ABDOMEN COMPLETE
1 series · 13 of 25 positions shown · non-contrast
Comparison: None.

CLINICAL DATA: Abdominal pain and diarrhea

EXAM:
ABDOMEN ULTRASOUND COMPLETE

[Series 1: us abdomen complete · 0.30mm/px · 13 of 88 slices shown]
[im 1/88]
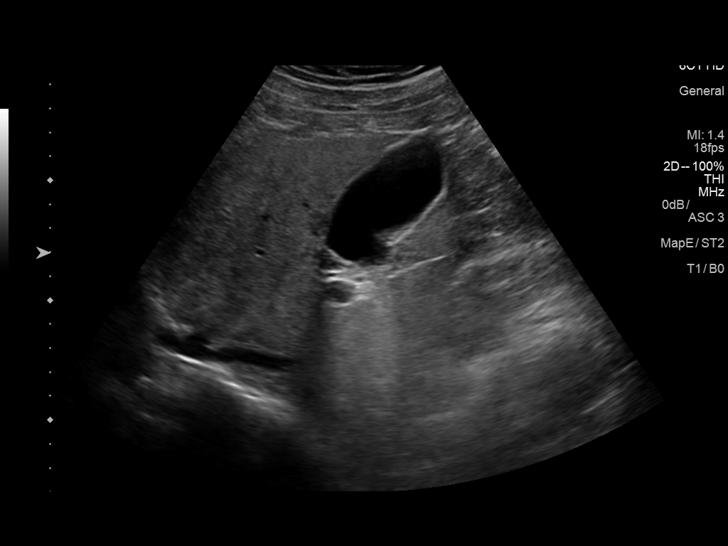
[im 8/88]
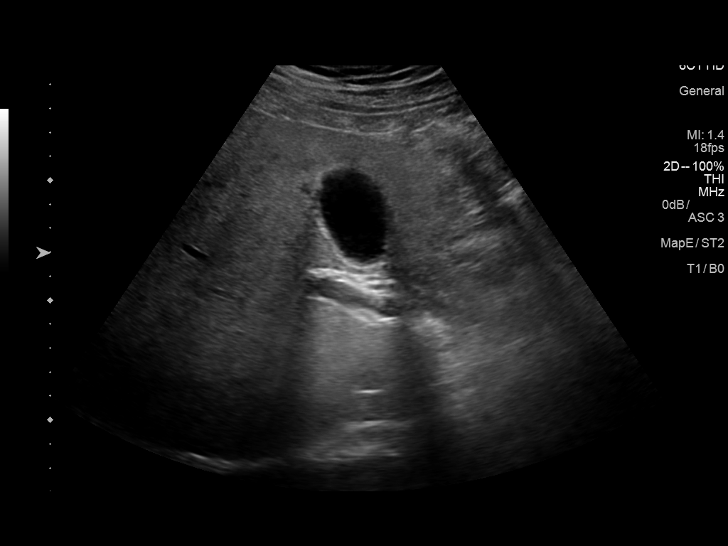
[im 15/88]
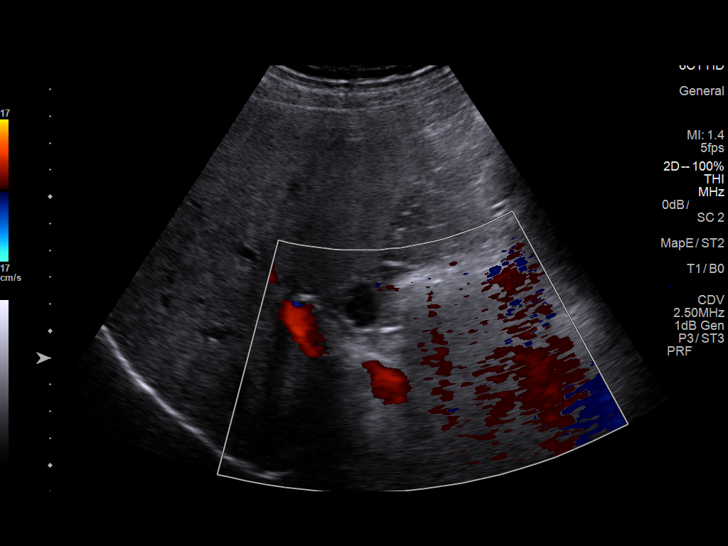
[im 22/88]
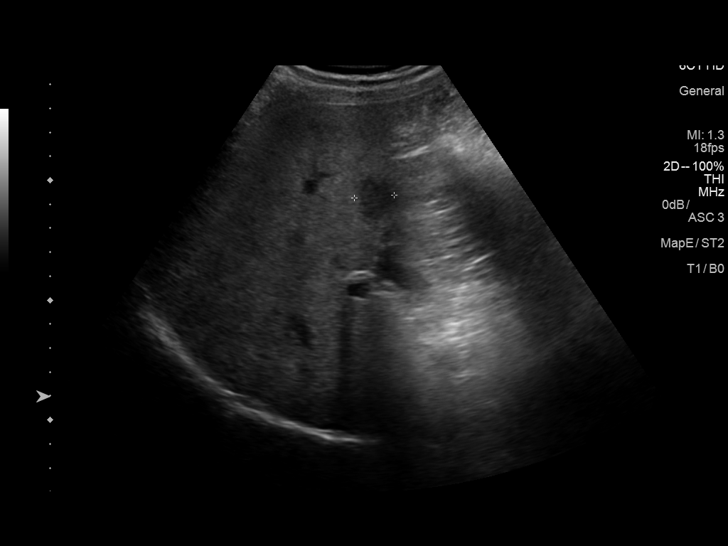
[im 30/88]
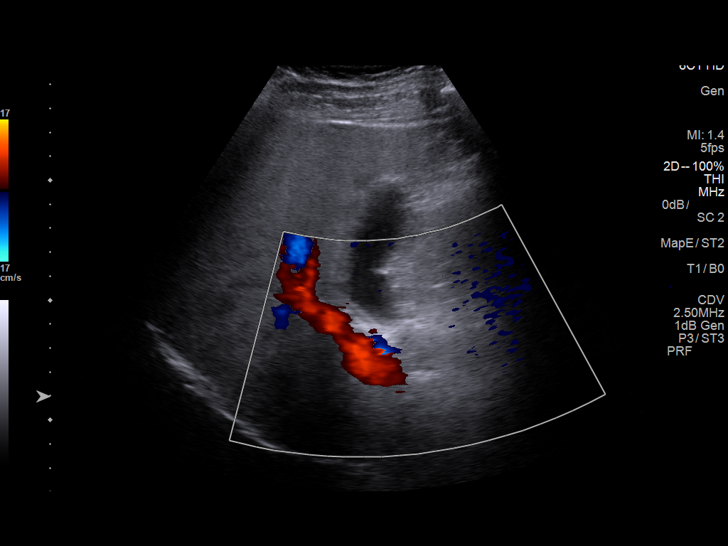
[im 37/88]
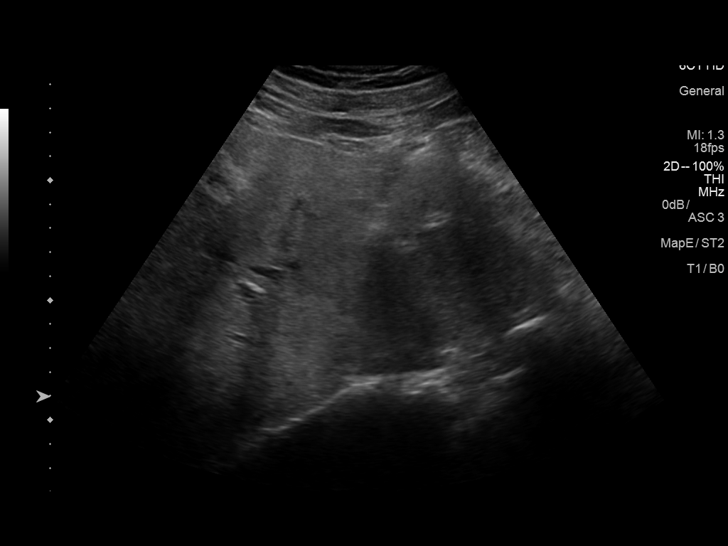
[im 44/88]
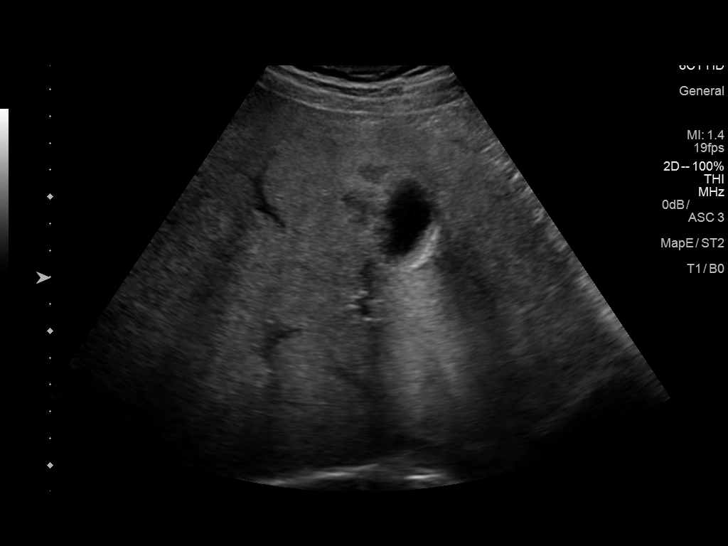
[im 51/88]
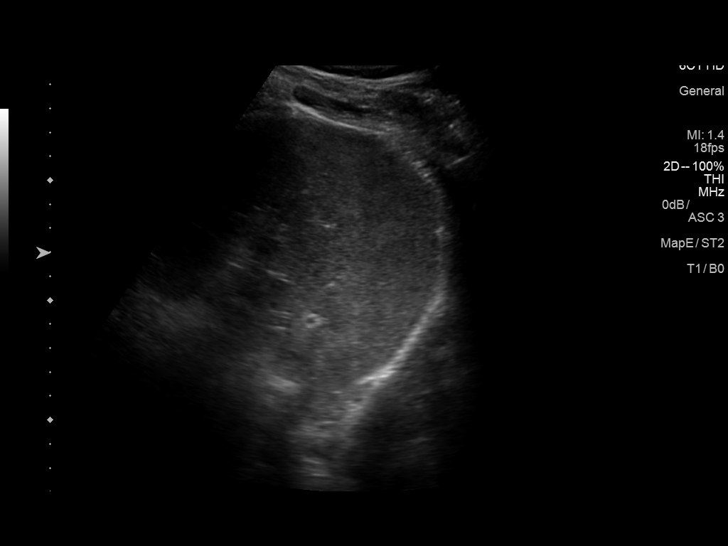
[im 59/88]
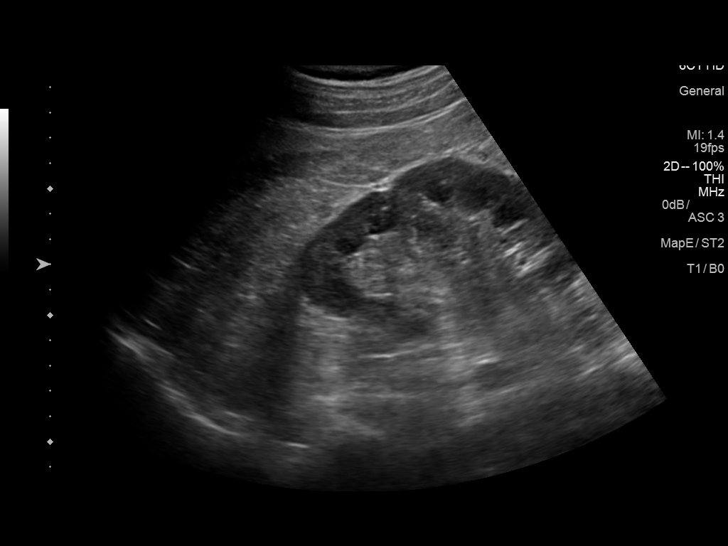
[im 66/88]
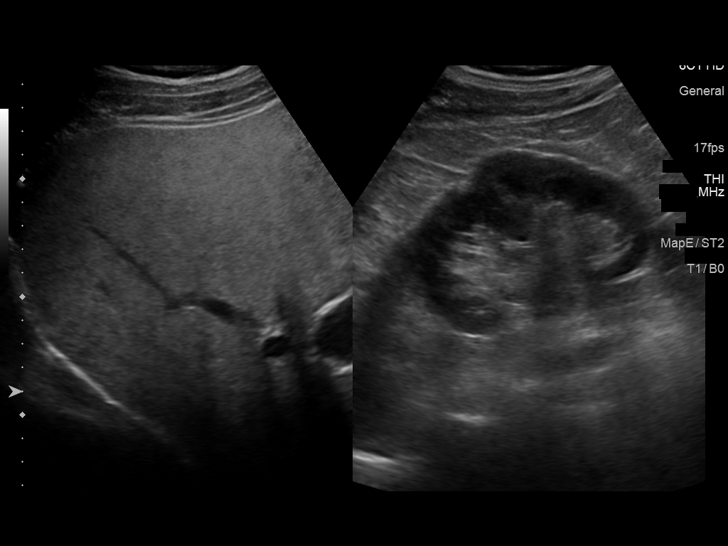
[im 73/88]
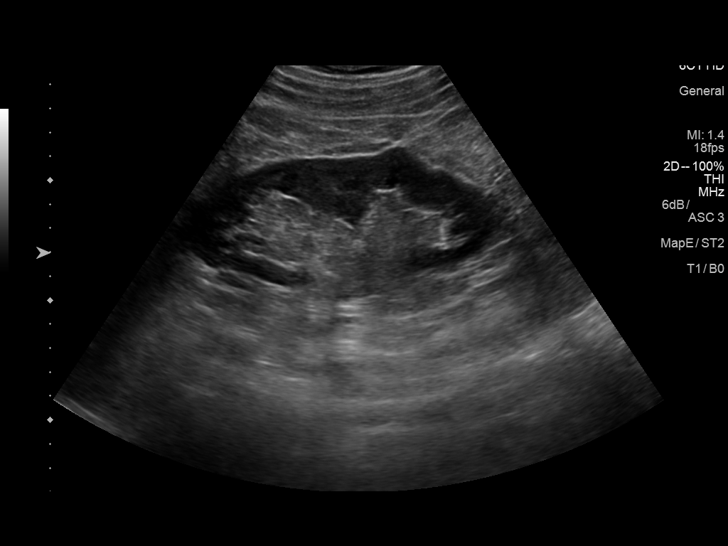
[im 80/88]
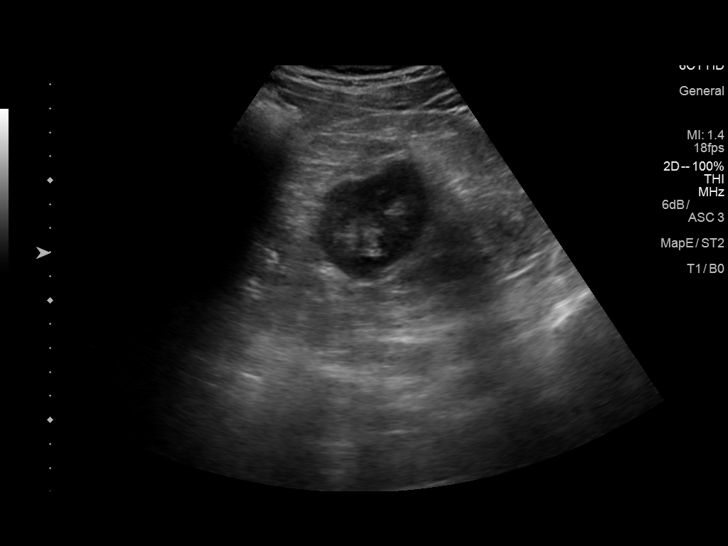
[im 88/88]
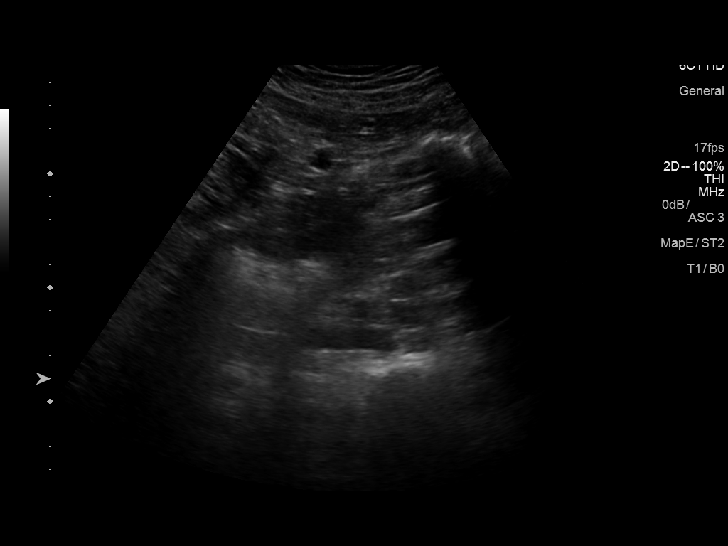

[13 of 25 positions shown; findings below may reference images not displayed]

FINDINGS: Gallbladder: No gallstones or wall thickening visualized. There is
no pericholecystic fluid. No sonographic Murphy sign noted by
sonographer.

Common bile duct: Diameter: 4 mm. No intrahepatic, common hepatic,
or common bile duct dilatation.

Liver: No focal lesion identified. There is diffuse increase in
liver echogenicity with apparent fatty sparing near the gallbladder
fossa. Portal vein is patent on color Doppler imaging with normal
direction of blood flow towards the liver.

IVC: No abnormality visualized.

Pancreas: No pancreatic mass or inflammatory focus.

Spleen: Size and appearance within normal limits.

Right Kidney: Length: 11.5 cm. Echogenicity within normal limits. No
mass or hydronephrosis visualized.

Left Kidney: Length: 13.3 cm. Echogenicity within normal limits. No
mass or hydronephrosis visualized.

Abdominal aorta: No aneurysm visualized. Note that portions of the
aorta are obscured by gas.

Other findings: No demonstrable ascites.
IMPRESSION: 1. Increase in liver echogenicity felt to be indicative of a degree
of hepatic steatosis. There is apparent fatty sparing near the
gallbladder fossa. While no focal liver lesion is evident beyond
apparent fatty sparing near the gallbladder fossa, it must be
cautioned that the sensitivity of ultrasound for detection of focal
liver lesions is diminished in this circumstance.

2. Portions of aorta obscured by gas. Visualized portions of aorta
appear unremarkable.

3.  Study otherwise unremarkable.

## 2018-11-17 DIAGNOSIS — M9903 Segmental and somatic dysfunction of lumbar region: Secondary | ICD-10-CM | POA: Diagnosis not present

## 2018-11-17 DIAGNOSIS — M9905 Segmental and somatic dysfunction of pelvic region: Secondary | ICD-10-CM | POA: Diagnosis not present

## 2018-11-17 DIAGNOSIS — M9901 Segmental and somatic dysfunction of cervical region: Secondary | ICD-10-CM | POA: Diagnosis not present

## 2018-11-17 DIAGNOSIS — M531 Cervicobrachial syndrome: Secondary | ICD-10-CM | POA: Diagnosis not present

## 2018-12-08 DIAGNOSIS — M9903 Segmental and somatic dysfunction of lumbar region: Secondary | ICD-10-CM | POA: Diagnosis not present

## 2018-12-08 DIAGNOSIS — M531 Cervicobrachial syndrome: Secondary | ICD-10-CM | POA: Diagnosis not present

## 2018-12-08 DIAGNOSIS — M9905 Segmental and somatic dysfunction of pelvic region: Secondary | ICD-10-CM | POA: Diagnosis not present

## 2018-12-08 DIAGNOSIS — M9901 Segmental and somatic dysfunction of cervical region: Secondary | ICD-10-CM | POA: Diagnosis not present

## 2018-12-29 DIAGNOSIS — M531 Cervicobrachial syndrome: Secondary | ICD-10-CM | POA: Diagnosis not present

## 2018-12-29 DIAGNOSIS — M9901 Segmental and somatic dysfunction of cervical region: Secondary | ICD-10-CM | POA: Diagnosis not present

## 2018-12-29 DIAGNOSIS — M9903 Segmental and somatic dysfunction of lumbar region: Secondary | ICD-10-CM | POA: Diagnosis not present

## 2018-12-29 DIAGNOSIS — M9905 Segmental and somatic dysfunction of pelvic region: Secondary | ICD-10-CM | POA: Diagnosis not present

## 2019-01-12 DIAGNOSIS — M9901 Segmental and somatic dysfunction of cervical region: Secondary | ICD-10-CM | POA: Diagnosis not present

## 2019-01-12 DIAGNOSIS — M9905 Segmental and somatic dysfunction of pelvic region: Secondary | ICD-10-CM | POA: Diagnosis not present

## 2019-01-12 DIAGNOSIS — M9903 Segmental and somatic dysfunction of lumbar region: Secondary | ICD-10-CM | POA: Diagnosis not present

## 2019-01-12 DIAGNOSIS — M531 Cervicobrachial syndrome: Secondary | ICD-10-CM | POA: Diagnosis not present

## 2019-01-19 DIAGNOSIS — M9905 Segmental and somatic dysfunction of pelvic region: Secondary | ICD-10-CM | POA: Diagnosis not present

## 2019-01-19 DIAGNOSIS — M9903 Segmental and somatic dysfunction of lumbar region: Secondary | ICD-10-CM | POA: Diagnosis not present

## 2019-01-19 DIAGNOSIS — M531 Cervicobrachial syndrome: Secondary | ICD-10-CM | POA: Diagnosis not present

## 2019-01-19 DIAGNOSIS — M9901 Segmental and somatic dysfunction of cervical region: Secondary | ICD-10-CM | POA: Diagnosis not present

## 2019-02-02 DIAGNOSIS — M531 Cervicobrachial syndrome: Secondary | ICD-10-CM | POA: Diagnosis not present

## 2019-02-02 DIAGNOSIS — M9905 Segmental and somatic dysfunction of pelvic region: Secondary | ICD-10-CM | POA: Diagnosis not present

## 2019-02-02 DIAGNOSIS — M9903 Segmental and somatic dysfunction of lumbar region: Secondary | ICD-10-CM | POA: Diagnosis not present

## 2019-02-02 DIAGNOSIS — M9901 Segmental and somatic dysfunction of cervical region: Secondary | ICD-10-CM | POA: Diagnosis not present

## 2019-02-02 DIAGNOSIS — H60592 Other noninfective acute otitis externa, left ear: Secondary | ICD-10-CM | POA: Diagnosis not present

## 2019-03-16 DIAGNOSIS — M542 Cervicalgia: Secondary | ICD-10-CM | POA: Diagnosis not present

## 2019-03-22 DIAGNOSIS — M531 Cervicobrachial syndrome: Secondary | ICD-10-CM | POA: Diagnosis not present

## 2019-03-22 DIAGNOSIS — M9905 Segmental and somatic dysfunction of pelvic region: Secondary | ICD-10-CM | POA: Diagnosis not present

## 2019-03-22 DIAGNOSIS — M9901 Segmental and somatic dysfunction of cervical region: Secondary | ICD-10-CM | POA: Diagnosis not present

## 2019-03-22 DIAGNOSIS — M9903 Segmental and somatic dysfunction of lumbar region: Secondary | ICD-10-CM | POA: Diagnosis not present

## 2019-03-30 DIAGNOSIS — M531 Cervicobrachial syndrome: Secondary | ICD-10-CM | POA: Diagnosis not present

## 2019-03-30 DIAGNOSIS — M9905 Segmental and somatic dysfunction of pelvic region: Secondary | ICD-10-CM | POA: Diagnosis not present

## 2019-03-30 DIAGNOSIS — M9901 Segmental and somatic dysfunction of cervical region: Secondary | ICD-10-CM | POA: Diagnosis not present

## 2019-03-30 DIAGNOSIS — M9903 Segmental and somatic dysfunction of lumbar region: Secondary | ICD-10-CM | POA: Diagnosis not present

## 2019-04-07 DIAGNOSIS — E785 Hyperlipidemia, unspecified: Secondary | ICD-10-CM | POA: Diagnosis not present

## 2019-04-07 DIAGNOSIS — I10 Essential (primary) hypertension: Secondary | ICD-10-CM | POA: Diagnosis not present

## 2019-04-07 DIAGNOSIS — H9202 Otalgia, left ear: Secondary | ICD-10-CM | POA: Diagnosis not present

## 2019-04-07 DIAGNOSIS — F445 Conversion disorder with seizures or convulsions: Secondary | ICD-10-CM | POA: Diagnosis not present

## 2019-04-07 DIAGNOSIS — M545 Low back pain: Secondary | ICD-10-CM | POA: Diagnosis not present

## 2019-04-12 DIAGNOSIS — R300 Dysuria: Secondary | ICD-10-CM | POA: Diagnosis not present

## 2019-04-12 DIAGNOSIS — I10 Essential (primary) hypertension: Secondary | ICD-10-CM | POA: Diagnosis not present

## 2019-04-13 DIAGNOSIS — M9903 Segmental and somatic dysfunction of lumbar region: Secondary | ICD-10-CM | POA: Diagnosis not present

## 2019-04-13 DIAGNOSIS — M9905 Segmental and somatic dysfunction of pelvic region: Secondary | ICD-10-CM | POA: Diagnosis not present

## 2019-04-13 DIAGNOSIS — M531 Cervicobrachial syndrome: Secondary | ICD-10-CM | POA: Diagnosis not present

## 2019-04-13 DIAGNOSIS — M9901 Segmental and somatic dysfunction of cervical region: Secondary | ICD-10-CM | POA: Diagnosis not present

## 2019-04-26 DIAGNOSIS — M9901 Segmental and somatic dysfunction of cervical region: Secondary | ICD-10-CM | POA: Diagnosis not present

## 2019-04-26 DIAGNOSIS — M9905 Segmental and somatic dysfunction of pelvic region: Secondary | ICD-10-CM | POA: Diagnosis not present

## 2019-04-26 DIAGNOSIS — M9903 Segmental and somatic dysfunction of lumbar region: Secondary | ICD-10-CM | POA: Diagnosis not present

## 2019-04-26 DIAGNOSIS — M531 Cervicobrachial syndrome: Secondary | ICD-10-CM | POA: Diagnosis not present

## 2019-05-10 DIAGNOSIS — M531 Cervicobrachial syndrome: Secondary | ICD-10-CM | POA: Diagnosis not present

## 2019-05-10 DIAGNOSIS — M9905 Segmental and somatic dysfunction of pelvic region: Secondary | ICD-10-CM | POA: Diagnosis not present

## 2019-05-10 DIAGNOSIS — M9901 Segmental and somatic dysfunction of cervical region: Secondary | ICD-10-CM | POA: Diagnosis not present

## 2019-05-10 DIAGNOSIS — M9903 Segmental and somatic dysfunction of lumbar region: Secondary | ICD-10-CM | POA: Diagnosis not present

## 2019-05-11 DIAGNOSIS — F251 Schizoaffective disorder, depressive type: Secondary | ICD-10-CM | POA: Diagnosis not present

## 2019-05-11 DIAGNOSIS — F603 Borderline personality disorder: Secondary | ICD-10-CM | POA: Diagnosis not present

## 2019-05-24 DIAGNOSIS — M9903 Segmental and somatic dysfunction of lumbar region: Secondary | ICD-10-CM | POA: Diagnosis not present

## 2019-05-24 DIAGNOSIS — M531 Cervicobrachial syndrome: Secondary | ICD-10-CM | POA: Diagnosis not present

## 2019-05-24 DIAGNOSIS — M9905 Segmental and somatic dysfunction of pelvic region: Secondary | ICD-10-CM | POA: Diagnosis not present

## 2019-05-24 DIAGNOSIS — M9901 Segmental and somatic dysfunction of cervical region: Secondary | ICD-10-CM | POA: Diagnosis not present

## 2019-06-05 ENCOUNTER — Other Ambulatory Visit: Payer: Self-pay | Admitting: *Deleted

## 2019-06-05 ENCOUNTER — Encounter: Payer: Self-pay | Admitting: Neurology

## 2019-06-05 ENCOUNTER — Other Ambulatory Visit: Payer: Self-pay

## 2019-06-05 ENCOUNTER — Ambulatory Visit (INDEPENDENT_AMBULATORY_CARE_PROVIDER_SITE_OTHER): Payer: Medicare PPO | Admitting: Neurology

## 2019-06-05 VITALS — BP 118/76 | HR 70 | Temp 97.2°F | Ht 67.75 in | Wt 225.0 lb

## 2019-06-05 DIAGNOSIS — M6281 Muscle weakness (generalized): Secondary | ICD-10-CM | POA: Diagnosis not present

## 2019-06-05 DIAGNOSIS — G43709 Chronic migraine without aura, not intractable, without status migrainosus: Secondary | ICD-10-CM | POA: Insufficient documentation

## 2019-06-05 DIAGNOSIS — IMO0002 Reserved for concepts with insufficient information to code with codable children: Secondary | ICD-10-CM

## 2019-06-05 DIAGNOSIS — R569 Unspecified convulsions: Secondary | ICD-10-CM | POA: Insufficient documentation

## 2019-06-05 MED ORDER — LAMOTRIGINE 100 MG PO TABS: 100.0000 mg | ORAL_TABLET | Freq: Two times a day (BID) | ORAL | 3 refills | Status: DC

## 2019-06-05 MED ORDER — LAMOTRIGINE 100 MG PO TABS: 100.0000 mg | ORAL_TABLET | Freq: Two times a day (BID) | ORAL | 11 refills | Status: DC

## 2019-06-05 MED ORDER — LAMOTRIGINE 25 MG PO TABS: ORAL_TABLET | ORAL | 0 refills | Status: DC

## 2019-06-05 NOTE — Progress Notes (Signed)
PATIENT: Arma HeadingMatthew A Vaquera DOB: 11/22/1965  Chief Complaint  Patient presents with  . Rule out seizure    Reports an asthma attack after smelling cleaning supplies then a second attack after smelling perfume.  Along with the latter event, he has also experienced tingling in back of neck/head, right arm spasms/stiffness.  He was conscious the entire time and remembers what happened.  He is concerned this may have been a seizure or a complex migraine.  States this has occurred 5-6 times.  Marland Kitchen. PCP    [facility], [facility]     HISTORICAL  Arma HeadingMatthew A Laur is a 53 year old male, [facility] in [facility] by [facility] of seizure-like activity, initial evaluation was on June 05, 2019.  I have reviewed and summarized the referring note from the referring physician.  He had past medical history of hypertension, hyperlipidemia, depression, PTSD, also reported a history of falling off from his two-story building, with transient loss of consciousness about a decade ago.  He began to have recurrent stereotypical episodes about 2008, each episode were similar, most recent episode was in September 2020.  He was dining with his family at a restaurant, he had asthma attack after smelling cleaning supplies, shortly afterwards, when he get into his car, he had another asthma attack triggered by perfume in the car, afterwards, he began to develop headache at the occipital region, he was the one driving, he was able to pull off his car, switch to passenger side with his wife, and then he had uncontrollable right hand and arm posturing, body stiffness, lasting 5 to 60 minutes, no loss of consciousness, he was a worry of the surrounding, he also noticed mild right facial pulling, afterwards, he has mild right arm, leg weakness, for more than 1 day, was noted by his primary care physician while he was checked.  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, governmentmath coach, he had 1 similar episode, uncontrollable right hand and arm posturing, lasting for few minutes, afterwards, he lost all his mass skill  He also reported long history of chronic migraine, lateralized severe pounding headache with associated light, noise sensitivity, lasting for couple hours, sometimes with nausea  He was treated with Topamax, complains of mental slowing, his migraine overall has improved, once every few months, but he has frequent visual auras,  I personally reviewed MRI of the brain with without contrast in September 2012, that was normal  REVIEW OF SYSTEMS: Full 14 system review of systems performed and notable only for as above All other review of systems were negative.  ALLERGIES: Allergies  Allergen Reactions  . Hydrocodone     HOME MEDICATIONS: Current Outpatient Medications  Medication Sig Dispense Refill  . ALBUTEROL IN Inhale 1 puff into the lungs as needed.    . Cholecalciferol (VITAMIN D3 PO) Take 1.25 mcg by mouth daily.    . cyclobenzaprine (FLEXERIL) 10 MG tablet Take 10 mg by mouth as needed for muscle spasms.    Marland Kitchen. lisinopril-hydrochlorothiazide (PRINZIDE,ZESTORETIC) 20-12.5 MG tablet Take by mouth daily.     . naproxen (NAPROSYN) 500 MG tablet Take 500 mg by mouth as needed.     . Omega-3 Fatty Acids (FISH OIL) 1000 MG CAPS Take 1,000 mg by mouth daily.    . propranolol (INDERAL) 10 MG tablet Take 10 mg by mouth daily.     . simvastatin (ZOCOR) 20 MG tablet Take  by mouth daily.     . traZODone (DESYREL) 100 MG tablet Take 100 mg by mouth daily.     Marland Kitchen UNABLE TO FIND Take 850 mg by mouth daily. Med Name: Egbert Garibaldi    . venlafaxine XR (EFFEXOR-XR) 150 MG 24 hr capsule Take 150 mg by mouth daily.     Marland Kitchen venlafaxine XR (EFFEXOR-XR) 37.5 MG 24 hr capsule Take 37.5 mg by mouth daily with breakfast.    . vitamin B-12 (CYANOCOBALAMIN) 1000 MCG tablet Take 1,000 mcg by mouth daily.     No current facility-administered medications  for this visit.     PAST MEDICAL HISTORY: Past Medical History:  Diagnosis Date  . Chest pain at rest   . Constipation   . Depression   . Exertional dyspnea   . Gout attack   . H/O degenerative disc disease   . HLD (hyperlipidemia)   . HTN (hypertension)   . Hx of scarlet fever    childhood - seizures  . Hypertriglyceridemia   . IBS (irritable bowel syndrome)   . Migraine   . Obesity   . Pseudoseizure   . Schizoaffective disorder ([place])     PAST SURGICAL HISTORY: Past Surgical History:  Procedure Laterality Date  . UMBILICAL HERNIA REPAIR      FAMILY HISTORY: Family History  Problem Relation Age of Onset  . CAD Mother 74       CABG x 4  . Stroke Father   . Atrial fibrillation Father   . Seizures Sister     SOCIAL HISTORY: Social History   Socioeconomic History  . Marital status: Married    Spouse name: Not on file  . Number of children: 2  . Years of education: college  . Highest education level: Associate degree: academic program  Occupational History  . Occupation: Disabled  Social Needs  . Financial resource strain: Not on file  . Food insecurity    Worry: Not on file    Inability: Not on file  . Transportation needs    Medical: Not on file    Non-medical: Not on file  Tobacco Use  . Smoking status: Former Smoker    Quit date: 1989    Years since quitting: 31.9  . Smokeless tobacco: Former Network engineer and Sexual Activity  . Alcohol use: Yes    Comment: 2-3 times per year  . Drug use: No  . Sexual activity: Not on file  Lifestyle  . Physical activity    Days per week: Not on file    Minutes per session: Not on file  . Stress: Not on file  Relationships  . Social Herbalist on phone: Not on file    Gets together: Not on file    Attends religious service: Not on file    Active member of club or organization: Not on file    Attends meetings of clubs or organizations: Not on file    Relationship status: Not on file  .  Intimate partner violence    Fear of current or ex partner: Not on file    Emotionally abused: Not on file    Physically abused: Not on file    Forced sexual activity: Not on file  Other Topics Concern  . Not on file  Social History Narrative   Lives at home with his wife.   Right-handed.   Caffeine use: 100-150mg  per day.     PHYSICAL EXAM   Vitals:   06/05/19  1440  BP: 118/76  Pulse: 70  Temp: (!) 97.2 F (36.2 C)  Weight: 225 lb (102.1 kg)  Height: 5' 7.75" (1.721 m)    Not recorded      Body mass index is 34.46 kg/m.  PHYSICAL EXAMNIATION:  Gen: NAD, conversant, well nourised, well groomed                     Cardiovascular: Regular rate rhythm, no peripheral edema, warm, nontender. Eyes: Conjunctivae clear without exudates or hemorrhage Neck: Supple, no carotid bruits. Pulmonary: Clear to auscultation bilaterally   NEUROLOGICAL EXAM:  MENTAL STATUS: Speech:    Speech is normal; fluent and spontaneous with normal comprehension.  Cognition:     Orientation to time, place and person     Normal recent and remote memory     Normal Attention span and concentration     Normal Language, naming, repeating,spontaneous speech     Fund of knowledge   CRANIAL NERVES: CN II: Visual fields are full to confrontation. Fundoscopic exam is normal with sharp discs and no vascular changes. Pupils are round equal and briskly reactive to light. CN III, IV, VI: extraocular movement are normal. No ptosis. CN V: Facial sensation is intact to pinprick in all 3 divisions bilaterally. Corneal responses are intact.  CN VII: Face is symmetric with normal eye closure CN VIII: Hearing is normal to causal conversation. CN IX, X: Phonation is normal. CN XI: Head turning and shoulder shrug are intact  MOTOR: There is no pronator drift of out-stretched arms. Muscle bulk and tone are normal. Muscle strength is normal.  REFLEXES: Reflexes are 2+ and symmetric at the biceps, triceps,  knees, and ankles. Plantar responses are flexor.  SENSORY: Intact to light touch, pinprick and vibratory sensation are intact in fingers and toes.  COORDINATION: There is no trunk or limb dysmetria noted.  GAIT/STANCE: He has mild atalgic gait  DIAGNOSTIC DATA (LABS, IMAGING, TESTING) - I reviewed patient records, labs, notes, testing and imaging myself where available.   ASSESSMENT AND PLAN  NAI DASCH is a 53 y.o. male   Seizure-like spells  Uncontrollable right hand and arm posturing, body stiffness,  Suggestive of left hemispheric partial seizure  Proceed with MRI of the brain with without contrast  EEG  After discussed with patient, we decided to proceed with antielliptic medications, he also has mood disorder, will start lamotrigine, titrating to 100 mg twice a day   Levert Feinstein, M.D. Ph.D.  [facility] [address] [place], [place] [address] Ph: [phone] Fax: [phone]  CC: [facility]

## 2019-06-06 ENCOUNTER — Telehealth: Payer: Self-pay | Admitting: *Deleted

## 2019-06-06 LAB — COMPREHENSIVE METABOLIC PANEL
ALT: 37 IU/L (ref 0–44)
AST: 29 IU/L (ref 0–40)
Albumin/Globulin Ratio: 1.5 (ref 1.2–2.2)
Albumin: 4.6 g/dL (ref 3.8–4.9)
Alkaline Phosphatase: 74 IU/L (ref 39–117)
BUN/Creatinine Ratio: 12 (ref 9–20)
BUN: 12 mg/dL (ref 6–24)
Bilirubin Total: 0.4 mg/dL (ref 0.0–1.2)
CO2: 25 mmol/L (ref 20–29)
Calcium: 9.8 mg/dL (ref 8.7–10.2)
Chloride: 100 mmol/L (ref 96–106)
Creatinine, Ser: 1.03 mg/dL (ref 0.76–1.27)
GFR calc Af Amer: 95 mL/min/{1.73_m2} (ref >59)
GFR calc non Af Amer: 82 mL/min/{1.73_m2} (ref >59)
Globulin, Total: 3.1 g/dL (ref 1.5–4.5)
Glucose: 97 mg/dL (ref 65–99)
Potassium: 4.4 mmol/L (ref 3.5–5.2)
Sodium: 139 mmol/L (ref 134–144)
Total Protein: 7.7 g/dL (ref 6.0–8.5)

## 2019-06-06 LAB — TSH: TSH: 0.975 u[IU]/mL (ref 0.450–4.500)

## 2019-06-06 NOTE — Telephone Encounter (Signed)
-----   Message from Marcial Pacas, MD sent at 06/06/2019 11:28 AM EST ----- Please call patient for normal laboratory result

## 2019-06-06 NOTE — Telephone Encounter (Signed)
I was able to speak with the patient and he verbalized understanding of his results.

## 2019-06-07 DIAGNOSIS — M531 Cervicobrachial syndrome: Secondary | ICD-10-CM | POA: Diagnosis not present

## 2019-06-07 DIAGNOSIS — M9901 Segmental and somatic dysfunction of cervical region: Secondary | ICD-10-CM | POA: Diagnosis not present

## 2019-06-07 DIAGNOSIS — M9905 Segmental and somatic dysfunction of pelvic region: Secondary | ICD-10-CM | POA: Diagnosis not present

## 2019-06-07 DIAGNOSIS — M9903 Segmental and somatic dysfunction of lumbar region: Secondary | ICD-10-CM | POA: Diagnosis not present

## 2019-06-14 DIAGNOSIS — M9905 Segmental and somatic dysfunction of pelvic region: Secondary | ICD-10-CM | POA: Diagnosis not present

## 2019-06-14 DIAGNOSIS — M9903 Segmental and somatic dysfunction of lumbar region: Secondary | ICD-10-CM | POA: Diagnosis not present

## 2019-06-14 DIAGNOSIS — M9901 Segmental and somatic dysfunction of cervical region: Secondary | ICD-10-CM | POA: Diagnosis not present

## 2019-06-14 DIAGNOSIS — M531 Cervicobrachial syndrome: Secondary | ICD-10-CM | POA: Diagnosis not present

## 2019-06-21 ENCOUNTER — Telehealth: Payer: Self-pay | Admitting: Neurology

## 2019-06-21 MED ORDER — LAMOTRIGINE 25 MG PO TABS: ORAL_TABLET | ORAL | 0 refills | Status: DC

## 2019-06-21 NOTE — Telephone Encounter (Signed)
The patient started Lamictal about 2 weeks ago, he is already taking 75 mg twice daily.  The ramp-up schedule was 50 mg every week, the maximum recommended titration for Lamictal is 50 mg every 2 weeks.  The patient will cut back on the Lamictal taking 50 mg twice daily for 2 weeks, then go to 75 mg twice daily for 2 weeks, then he is to contact us to get on the 100 mg tablets.  If the rash does not go away in the next 5 or 6 days on a lower dose of Lamictal, he is to contact our office.

## 2019-06-21 NOTE — Telephone Encounter (Signed)
Faxed printed/signed rx lamotrigine 25mg  tablet #140 to [facility] at [phone]. Received fax confirmation.

## 2019-06-21 NOTE — Telephone Encounter (Signed)
Patient called to advise that he has developed a rash and is unsure if it is connected with his lamoTRIgine (LAMICTAL) 25 MG tablet but would like to go ahead and check in just to be safe.   Please follow up.

## 2019-06-21 NOTE — Telephone Encounter (Signed)
I reached out to the pt. He sts 3-4 days ago a rash became evident once he increased Lamictal 25 mg to three times per day. Pt was on a tapering Lamictal dosage  He reports the rash is on his upper back and upper and lower stomach. Pt describes the rash as itchy, red and mostly flat to the skin. Pt sts he has noticed some raised red areas on the upper back. He denies any changes to home lotions, soaps or detergent. Pt denies stating any new medications or introducing any new foods.  Pt was advised I would send message to Work in MD to review since Dr. Krista Blue is out of the office.

## 2019-06-28 ENCOUNTER — Ambulatory Visit (INDEPENDENT_AMBULATORY_CARE_PROVIDER_SITE_OTHER): Payer: Medicare PPO | Admitting: Neurology

## 2019-06-28 ENCOUNTER — Other Ambulatory Visit: Payer: Self-pay

## 2019-06-28 DIAGNOSIS — IMO0002 Reserved for concepts with insufficient information to code with codable children: Secondary | ICD-10-CM

## 2019-06-28 DIAGNOSIS — R569 Unspecified convulsions: Secondary | ICD-10-CM | POA: Diagnosis not present

## 2019-06-28 DIAGNOSIS — G43709 Chronic migraine without aura, not intractable, without status migrainosus: Secondary | ICD-10-CM

## 2019-06-28 DIAGNOSIS — M6281 Muscle weakness (generalized): Secondary | ICD-10-CM

## 2019-06-29 ENCOUNTER — Telehealth: Payer: Self-pay | Admitting: Neurology

## 2019-06-29 NOTE — Telephone Encounter (Signed)
Patient called on call physician, that he has developed rash taking Lamotrigine. I have asked him to stop lamotrigine immediately, increase water intake.  May take Benadryl as needed for itch, if he has worsening symptoms, may call back.  Sharyn Lull, please check on him again

## 2019-07-03 ENCOUNTER — Encounter: Payer: Self-pay | Admitting: *Deleted

## 2019-07-03 NOTE — Telephone Encounter (Signed)
I have spoken with the patient.  His rash has improved.  No further itching but still has redness and burning sensation on face when touched.  He has increased his water intake and is still taking Benadryl at bedtime.

## 2019-07-03 NOTE — Addendum Note (Signed)
Addended by: Lindell Spar C on: 07/03/2019 10:30 AM   Modules accepted: Orders

## 2019-07-05 DIAGNOSIS — M9905 Segmental and somatic dysfunction of pelvic region: Secondary | ICD-10-CM | POA: Diagnosis not present

## 2019-07-05 DIAGNOSIS — M9901 Segmental and somatic dysfunction of cervical region: Secondary | ICD-10-CM | POA: Diagnosis not present

## 2019-07-05 DIAGNOSIS — M531 Cervicobrachial syndrome: Secondary | ICD-10-CM | POA: Diagnosis not present

## 2019-07-05 DIAGNOSIS — M9903 Segmental and somatic dysfunction of lumbar region: Secondary | ICD-10-CM | POA: Diagnosis not present

## 2019-07-07 NOTE — Procedures (Signed)
   HISTORY: 54 year old male, presented with seizure-like activities  TECHNIQUE:  This is a routine 16 channel EEG recording with one channel devoted to a limited EKG recording.  It was performed during wakefulness, drowsiness and asleep.  Hyperventilation and photic stimulation were performed as activating procedures.  There are minimum muscle and movement artifact noted.  Upon maximum arousal, posterior dominant waking rhythm consistent of rhythmic alpha range activity, with frequency of 9 hz. Activities are symmetric over the bilateral posterior derivations and attenuated with eye opening.  Hyperventilation produced mild/moderate buildup with higher amplitude and the slower activities noted.  Photic stimulation did not alter the tracing.  During EEG recording, patient developed drowsiness and no deeper stage of sleep was achieved During EEG recording, there was no epileptiform discharge noted.  EKG demonstrate sinus rhythm, with heart rate of 72 bpm  CONCLUSION: This is a  normal awake EEG.  There is no electrodiagnostic evidence of epileptiform discharge.  Levert Feinstein, M.D. Ph.D.  [facility] [address] [place], [place] [address] Phone: [phone] Fax:      [phone]

## 2019-07-10 ENCOUNTER — Ambulatory Visit
Admission: RE | Admit: 2019-07-10 | Discharge: 2019-07-10 | Disposition: A | Discharge status: Home / Self Care | Payer: Medicare PPO | Source: Ambulatory Visit | Attending: Neurology | Admitting: Neurology

## 2019-07-10 ENCOUNTER — Other Ambulatory Visit: Payer: Self-pay

## 2019-07-10 DIAGNOSIS — M6281 Muscle weakness (generalized): Secondary | ICD-10-CM

## 2019-07-10 MED ORDER — GADOBENATE DIMEGLUMINE 529 MG/ML IV SOLN
20.0000 mL | Freq: Once | INTRAVENOUS | Status: AC | PRN
  Administered 2019-07-10: 20 mL via INTRAVENOUS

## 2019-07-12 ENCOUNTER — Telehealth: Payer: Self-pay | Admitting: *Deleted

## 2019-07-12 NOTE — Telephone Encounter (Signed)
I called the patient and notified him of his results. He verbalized understanding.

## 2019-07-12 NOTE — Telephone Encounter (Signed)
-----   Message from Vikram R Penumalli, MD sent at 07/12/2019  4:27 PM EST ----- Unremarkable imaging results. Please call patient. Continue current plan. -VRP 

## 2019-07-18 ENCOUNTER — Telehealth: Payer: Self-pay

## 2019-07-18 NOTE — Telephone Encounter (Signed)
Lamotrigine  Patient called to report that where he had the rash that he developed while taking the lamotrigine he will get bee sting feelings. Patient states it is very uncomfortable and distracting especially when driving and they would like to know if they would need to be prescribed something else to help with this.

## 2019-07-18 NOTE — Telephone Encounter (Signed)
The drug should have resolved by now. I think he should be evaluated by primary care who knows a little bit more about rashes than we do, does he have a rash currently? In the meantime I would prescribe a medrol dosepak to see if that helps. Can you get a little bit more info plese Carl Flynn - is there a rash there, is it new? Also if no contraindications and they agree we can order a medrol dosepak and I think they should have primary care take a look or send [place] a picture of any rash he still has. thanks

## 2019-07-19 DIAGNOSIS — M9903 Segmental and somatic dysfunction of lumbar region: Secondary | ICD-10-CM | POA: Diagnosis not present

## 2019-07-19 DIAGNOSIS — M9901 Segmental and somatic dysfunction of cervical region: Secondary | ICD-10-CM | POA: Diagnosis not present

## 2019-07-19 DIAGNOSIS — M531 Cervicobrachial syndrome: Secondary | ICD-10-CM | POA: Diagnosis not present

## 2019-07-19 DIAGNOSIS — M9905 Segmental and somatic dysfunction of pelvic region: Secondary | ICD-10-CM | POA: Diagnosis not present

## 2019-07-19 MED ORDER — METHYLPREDNISOLONE 4 MG PO TBPK: ORAL_TABLET | ORAL | 0 refills | Status: DC

## 2019-07-19 NOTE — Telephone Encounter (Signed)
I called the patient back. Denies any visible rash. However, he is still having burning, stinging sensations in the same area where the rash was once present. He is agreeable to the plan below and would like to try a Medrol dose pack.  Per vo by Dr. Lucia Gaskins, ok to provide this prescription to the pharmacy.

## 2019-08-10 DIAGNOSIS — M9903 Segmental and somatic dysfunction of lumbar region: Secondary | ICD-10-CM | POA: Diagnosis not present

## 2019-08-10 DIAGNOSIS — M531 Cervicobrachial syndrome: Secondary | ICD-10-CM | POA: Diagnosis not present

## 2019-08-10 DIAGNOSIS — M9901 Segmental and somatic dysfunction of cervical region: Secondary | ICD-10-CM | POA: Diagnosis not present

## 2019-08-10 DIAGNOSIS — M9905 Segmental and somatic dysfunction of pelvic region: Secondary | ICD-10-CM | POA: Diagnosis not present

## 2019-08-24 DIAGNOSIS — M531 Cervicobrachial syndrome: Secondary | ICD-10-CM | POA: Diagnosis not present

## 2019-08-24 DIAGNOSIS — M9903 Segmental and somatic dysfunction of lumbar region: Secondary | ICD-10-CM | POA: Diagnosis not present

## 2019-08-24 DIAGNOSIS — M9901 Segmental and somatic dysfunction of cervical region: Secondary | ICD-10-CM | POA: Diagnosis not present

## 2019-08-24 DIAGNOSIS — M9905 Segmental and somatic dysfunction of pelvic region: Secondary | ICD-10-CM | POA: Diagnosis not present

## 2019-09-14 DIAGNOSIS — M9903 Segmental and somatic dysfunction of lumbar region: Secondary | ICD-10-CM | POA: Diagnosis not present

## 2019-09-14 DIAGNOSIS — M9905 Segmental and somatic dysfunction of pelvic region: Secondary | ICD-10-CM | POA: Diagnosis not present

## 2019-09-14 DIAGNOSIS — M9901 Segmental and somatic dysfunction of cervical region: Secondary | ICD-10-CM | POA: Diagnosis not present

## 2019-09-14 DIAGNOSIS — M531 Cervicobrachial syndrome: Secondary | ICD-10-CM | POA: Diagnosis not present

## 2019-10-04 DIAGNOSIS — M9903 Segmental and somatic dysfunction of lumbar region: Secondary | ICD-10-CM | POA: Diagnosis not present

## 2019-10-04 DIAGNOSIS — M9901 Segmental and somatic dysfunction of cervical region: Secondary | ICD-10-CM | POA: Diagnosis not present

## 2019-10-04 DIAGNOSIS — M9905 Segmental and somatic dysfunction of pelvic region: Secondary | ICD-10-CM | POA: Diagnosis not present

## 2019-10-04 DIAGNOSIS — M531 Cervicobrachial syndrome: Secondary | ICD-10-CM | POA: Diagnosis not present

## 2019-10-13 DIAGNOSIS — E785 Hyperlipidemia, unspecified: Secondary | ICD-10-CM | POA: Diagnosis not present

## 2019-10-13 DIAGNOSIS — J9801 Acute bronchospasm: Secondary | ICD-10-CM | POA: Diagnosis not present

## 2019-10-13 DIAGNOSIS — M79601 Pain in right arm: Secondary | ICD-10-CM | POA: Diagnosis not present

## 2019-10-13 DIAGNOSIS — I10 Essential (primary) hypertension: Secondary | ICD-10-CM | POA: Diagnosis not present

## 2019-10-13 DIAGNOSIS — M79602 Pain in left arm: Secondary | ICD-10-CM | POA: Diagnosis not present

## 2019-10-13 DIAGNOSIS — M549 Dorsalgia, unspecified: Secondary | ICD-10-CM | POA: Diagnosis not present

## 2019-10-18 DIAGNOSIS — M531 Cervicobrachial syndrome: Secondary | ICD-10-CM | POA: Diagnosis not present

## 2019-10-18 DIAGNOSIS — M9901 Segmental and somatic dysfunction of cervical region: Secondary | ICD-10-CM | POA: Diagnosis not present

## 2019-10-18 DIAGNOSIS — M9903 Segmental and somatic dysfunction of lumbar region: Secondary | ICD-10-CM | POA: Diagnosis not present

## 2019-10-18 DIAGNOSIS — M9905 Segmental and somatic dysfunction of pelvic region: Secondary | ICD-10-CM | POA: Diagnosis not present

## 2019-11-02 DIAGNOSIS — M531 Cervicobrachial syndrome: Secondary | ICD-10-CM | POA: Diagnosis not present

## 2019-11-02 DIAGNOSIS — M9901 Segmental and somatic dysfunction of cervical region: Secondary | ICD-10-CM | POA: Diagnosis not present

## 2019-11-02 DIAGNOSIS — F251 Schizoaffective disorder, depressive type: Secondary | ICD-10-CM | POA: Diagnosis not present

## 2019-11-02 DIAGNOSIS — M9905 Segmental and somatic dysfunction of pelvic region: Secondary | ICD-10-CM | POA: Diagnosis not present

## 2019-11-02 DIAGNOSIS — F603 Borderline personality disorder: Secondary | ICD-10-CM | POA: Diagnosis not present

## 2019-11-02 DIAGNOSIS — M9903 Segmental and somatic dysfunction of lumbar region: Secondary | ICD-10-CM | POA: Diagnosis not present

## 2019-11-16 DIAGNOSIS — M9905 Segmental and somatic dysfunction of pelvic region: Secondary | ICD-10-CM | POA: Diagnosis not present

## 2019-11-16 DIAGNOSIS — M9903 Segmental and somatic dysfunction of lumbar region: Secondary | ICD-10-CM | POA: Diagnosis not present

## 2019-11-16 DIAGNOSIS — M531 Cervicobrachial syndrome: Secondary | ICD-10-CM | POA: Diagnosis not present

## 2019-11-16 DIAGNOSIS — M9901 Segmental and somatic dysfunction of cervical region: Secondary | ICD-10-CM | POA: Diagnosis not present

## 2019-11-17 DIAGNOSIS — R200 Anesthesia of skin: Secondary | ICD-10-CM | POA: Diagnosis not present

## 2019-11-17 DIAGNOSIS — R202 Paresthesia of skin: Secondary | ICD-10-CM | POA: Diagnosis not present

## 2019-11-17 DIAGNOSIS — F329 Major depressive disorder, single episode, unspecified: Secondary | ICD-10-CM | POA: Diagnosis not present

## 2019-11-30 DIAGNOSIS — M531 Cervicobrachial syndrome: Secondary | ICD-10-CM | POA: Diagnosis not present

## 2019-11-30 DIAGNOSIS — M9903 Segmental and somatic dysfunction of lumbar region: Secondary | ICD-10-CM | POA: Diagnosis not present

## 2019-11-30 DIAGNOSIS — M9901 Segmental and somatic dysfunction of cervical region: Secondary | ICD-10-CM | POA: Diagnosis not present

## 2019-11-30 DIAGNOSIS — M9905 Segmental and somatic dysfunction of pelvic region: Secondary | ICD-10-CM | POA: Diagnosis not present

## 2019-12-14 DIAGNOSIS — M531 Cervicobrachial syndrome: Secondary | ICD-10-CM | POA: Diagnosis not present

## 2019-12-14 DIAGNOSIS — M9903 Segmental and somatic dysfunction of lumbar region: Secondary | ICD-10-CM | POA: Diagnosis not present

## 2019-12-14 DIAGNOSIS — M9905 Segmental and somatic dysfunction of pelvic region: Secondary | ICD-10-CM | POA: Diagnosis not present

## 2019-12-14 DIAGNOSIS — M9901 Segmental and somatic dysfunction of cervical region: Secondary | ICD-10-CM | POA: Diagnosis not present

## 2019-12-18 DIAGNOSIS — G5602 Carpal tunnel syndrome, left upper limb: Secondary | ICD-10-CM | POA: Diagnosis not present

## 2019-12-21 DIAGNOSIS — M531 Cervicobrachial syndrome: Secondary | ICD-10-CM | POA: Diagnosis not present

## 2019-12-21 DIAGNOSIS — M9903 Segmental and somatic dysfunction of lumbar region: Secondary | ICD-10-CM | POA: Diagnosis not present

## 2019-12-21 DIAGNOSIS — M9905 Segmental and somatic dysfunction of pelvic region: Secondary | ICD-10-CM | POA: Diagnosis not present

## 2019-12-21 DIAGNOSIS — M9901 Segmental and somatic dysfunction of cervical region: Secondary | ICD-10-CM | POA: Diagnosis not present

## 2019-12-28 DIAGNOSIS — F603 Borderline personality disorder: Secondary | ICD-10-CM | POA: Diagnosis not present

## 2019-12-28 DIAGNOSIS — M531 Cervicobrachial syndrome: Secondary | ICD-10-CM | POA: Diagnosis not present

## 2019-12-28 DIAGNOSIS — M9901 Segmental and somatic dysfunction of cervical region: Secondary | ICD-10-CM | POA: Diagnosis not present

## 2019-12-28 DIAGNOSIS — M9903 Segmental and somatic dysfunction of lumbar region: Secondary | ICD-10-CM | POA: Diagnosis not present

## 2019-12-28 DIAGNOSIS — M9905 Segmental and somatic dysfunction of pelvic region: Secondary | ICD-10-CM | POA: Diagnosis not present

## 2019-12-28 DIAGNOSIS — F251 Schizoaffective disorder, depressive type: Secondary | ICD-10-CM | POA: Diagnosis not present

## 2020-01-04 DIAGNOSIS — M9903 Segmental and somatic dysfunction of lumbar region: Secondary | ICD-10-CM | POA: Diagnosis not present

## 2020-01-04 DIAGNOSIS — M531 Cervicobrachial syndrome: Secondary | ICD-10-CM | POA: Diagnosis not present

## 2020-01-04 DIAGNOSIS — M9905 Segmental and somatic dysfunction of pelvic region: Secondary | ICD-10-CM | POA: Diagnosis not present

## 2020-01-04 DIAGNOSIS — M9901 Segmental and somatic dysfunction of cervical region: Secondary | ICD-10-CM | POA: Diagnosis not present

## 2020-01-18 DIAGNOSIS — M9901 Segmental and somatic dysfunction of cervical region: Secondary | ICD-10-CM | POA: Diagnosis not present

## 2020-01-18 DIAGNOSIS — M531 Cervicobrachial syndrome: Secondary | ICD-10-CM | POA: Diagnosis not present

## 2020-01-18 DIAGNOSIS — M9905 Segmental and somatic dysfunction of pelvic region: Secondary | ICD-10-CM | POA: Diagnosis not present

## 2020-01-18 DIAGNOSIS — M9903 Segmental and somatic dysfunction of lumbar region: Secondary | ICD-10-CM | POA: Diagnosis not present

## 2020-01-29 DIAGNOSIS — F603 Borderline personality disorder: Secondary | ICD-10-CM | POA: Diagnosis not present

## 2020-01-29 DIAGNOSIS — F251 Schizoaffective disorder, depressive type: Secondary | ICD-10-CM | POA: Diagnosis not present

## 2020-02-01 DIAGNOSIS — M9905 Segmental and somatic dysfunction of pelvic region: Secondary | ICD-10-CM | POA: Diagnosis not present

## 2020-02-01 DIAGNOSIS — M531 Cervicobrachial syndrome: Secondary | ICD-10-CM | POA: Diagnosis not present

## 2020-02-01 DIAGNOSIS — M9901 Segmental and somatic dysfunction of cervical region: Secondary | ICD-10-CM | POA: Diagnosis not present

## 2020-02-01 DIAGNOSIS — M9903 Segmental and somatic dysfunction of lumbar region: Secondary | ICD-10-CM | POA: Diagnosis not present

## 2020-02-09 DIAGNOSIS — G5602 Carpal tunnel syndrome, left upper limb: Secondary | ICD-10-CM | POA: Diagnosis not present

## 2020-02-12 DIAGNOSIS — M542 Cervicalgia: Secondary | ICD-10-CM | POA: Diagnosis not present

## 2020-02-21 DIAGNOSIS — M531 Cervicobrachial syndrome: Secondary | ICD-10-CM | POA: Diagnosis not present

## 2020-02-21 DIAGNOSIS — M9903 Segmental and somatic dysfunction of lumbar region: Secondary | ICD-10-CM | POA: Diagnosis not present

## 2020-02-21 DIAGNOSIS — M9901 Segmental and somatic dysfunction of cervical region: Secondary | ICD-10-CM | POA: Diagnosis not present

## 2020-02-21 DIAGNOSIS — M9905 Segmental and somatic dysfunction of pelvic region: Secondary | ICD-10-CM | POA: Diagnosis not present

## 2020-02-28 DIAGNOSIS — F251 Schizoaffective disorder, depressive type: Secondary | ICD-10-CM | POA: Diagnosis not present

## 2020-02-28 DIAGNOSIS — F603 Borderline personality disorder: Secondary | ICD-10-CM | POA: Diagnosis not present

## 2020-02-28 DIAGNOSIS — M542 Cervicalgia: Secondary | ICD-10-CM | POA: Diagnosis not present

## 2020-02-29 DIAGNOSIS — Z03818 Encounter for observation for suspected exposure to other biological agents ruled out: Secondary | ICD-10-CM | POA: Diagnosis not present

## 2020-02-29 DIAGNOSIS — R05 Cough: Secondary | ICD-10-CM | POA: Diagnosis not present

## 2020-03-01 DIAGNOSIS — J4531 Mild persistent asthma with (acute) exacerbation: Secondary | ICD-10-CM | POA: Diagnosis not present

## 2020-03-08 DIAGNOSIS — M542 Cervicalgia: Secondary | ICD-10-CM | POA: Diagnosis not present

## 2020-03-12 DIAGNOSIS — J189 Pneumonia, unspecified organism: Secondary | ICD-10-CM | POA: Diagnosis not present

## 2020-03-26 DIAGNOSIS — M65312 Trigger thumb, left thumb: Secondary | ICD-10-CM | POA: Diagnosis not present

## 2020-03-26 DIAGNOSIS — G5602 Carpal tunnel syndrome, left upper limb: Secondary | ICD-10-CM | POA: Diagnosis not present

## 2020-04-04 ENCOUNTER — Other Ambulatory Visit: Payer: Self-pay

## 2020-04-04 ENCOUNTER — Encounter: Payer: Self-pay | Admitting: Internal Medicine

## 2020-04-04 ENCOUNTER — Ambulatory Visit: Payer: Medicare PPO | Admitting: Internal Medicine

## 2020-04-04 VITALS — BP 120/80 | HR 116 | Temp 98.0°F | Ht 67.75 in | Wt 238.4 lb

## 2020-04-04 DIAGNOSIS — J4530 Mild persistent asthma, uncomplicated: Secondary | ICD-10-CM | POA: Diagnosis not present

## 2020-04-04 DIAGNOSIS — M5030 Other cervical disc degeneration, unspecified cervical region: Secondary | ICD-10-CM | POA: Diagnosis not present

## 2020-04-04 NOTE — Patient Instructions (Addendum)
The patient should have follow up scheduled with myself in 6 months.   Prior to next visit patient should have: Spirometry/Feno  Take the albuterol rescue inhaler every 4 to 6 hours as needed for wheezing or shortness of breath. You can also take it 15 minutes before exercise or exertional activity. Side effects include heart racing or pounding, jitters or anxiety. If you have a history of an irregular heart rhythm, it can make this worse. Can also give some patients a hard time sleeping.  To inhale the aerosol using an inhaler, follow these steps:  1. Remove the protective dust cap from the end of the mouthpiece. If the dust cap was not placed on the mouthpiece, check the mouthpiece for dirt or other objects. Be sure that the canister is fully and firmly inserted in the mouthpiece. 2. If you are using the inhaler for the first time or if you have not used the inhaler in more than 14 days, you will need to prime it. You may also need to prime the inhaler if it has been dropped. Ask your pharmacist or check the manufacturer's information if this happens. To prime the inhaler, shake it well and then press down on the canister 4 times to release 4 sprays into the air, away from your face. Be careful not to get albuterol in your eyes. 3. Shake the inhaler well. 4. Breathe out as completely as possible through your mouth. 4. Hold the canister with the mouthpiece on the bottom, facing you and the canister pointing upward. Place the open end of the mouthpiece into your mouth. Close your lips tightly around the mouthpiece. 6. Breathe in slowly and deeply through the mouthpiece.At the same time, press down once on the container to spray the medication into your mouth. 7. Try to hold your breath for 10 seconds. remove the inhaler, and breathe out slowly. 8. If you were told to use 2 puffs, wait 1 minute and then repeat steps 3-7. 9. Replace the protective cap on the inhaler. 10. Clean your inhaler  regularly. Follow the manufacturer's directions carefully and ask your doctor or pharmacist if you have any questions about cleaning your inhaler.  Check the back of the inhaler to keep track of the total number of doses left on the inhaler.     By learning about asthma and how it can be controlled, you take an important step toward managing this disease. Work closely with your asthma care team to learn all you can about your asthma, how to avoid triggers, what your medications do, and how to take them correctly. With proper care, you can live free of asthma symptoms and maintain a normal, healthy lifestyle.   What is asthma? Asthma is a chronic disease that affects the airways of the lungs. During normal breathing, the bands of muscle that surround the airways are relaxed and air moves freely. During an asthma episode or "attack," there are three main changes that stop air from moving easily through the airways:  The bands of muscle that surround the airways tighten and make the airways narrow. This tightening is called bronchospasm.   The lining of the airways becomes swollen or inflamed.   The cells that line the airways produce more mucus, which is thicker than normal and clogs the airways.  These three factors - bronchospasm, inflammation, and mucus production - cause symptoms such as difficulty breathing, wheezing, and coughing.  What are the most common symptoms of asthma? Asthma symptoms are not the  same for everyone. They can even change from episode to episode in the same person. Also, you may have only one symptom of asthma, such as cough, but another person may have all the symptoms of asthma. It is important to know all the symptoms of asthma and to be aware that your asthma can present in any of these ways at any time. The most common symptoms include: . Coughing, especially at night  . Shortness of breath  . Wheezing  . Chest tightness, pain, or pressure   Who is affected by  asthma? Asthma affects 22 million Americans; about 6 million of these are children under age 56. People who have a family history of asthma have an increased risk of developing the disease. Asthma is also more common in people who have allergies or who are exposed to tobacco smoke. However, anyone can develop asthma at any time. Some people may have asthma all of their lives, while others may develop it as adults.  What causes asthma? The airways in a person with asthma are very sensitive and react to many things, or "triggers." Contact with these triggers causes asthma symptoms. One of the most important parts of asthma control is to identify your triggers and then avoid them when possible. The only trigger you do not want to avoid is exercise. Pre-treatment with medicines before exercise can allow you to stay active yet avoid asthma symptoms. Common asthma triggers include: 1. Infections (colds, viruses, flu, sinus infections)  2. Exercise  3. Weather (changes in temperature and/or humidity, cold air)  4. Tobacco smoke  5. Allergens (dust mites, pollens, pets, mold spores, cockroaches, and sometimes foods)  6. Irritants (strong odors from cleaning products, perfume, wood smoke, air pollution)  7. Strong emotions such as crying or laughing hard  8. Some medications   How is asthma diagnosed? To diagnose asthma, your doctor will first review your medical history, family history, and symptoms. Your doctor will want to know any past history of breathing problems you may have had, as well as a family history of asthma, allergies, eczema (a bumpy, itchy skin rash caused by allergies), or other lung disease. It is important that you describe your symptoms in detail (cough, wheeze, shortness of breath, chest tightness), including when and how often they occur. The doctor will perform a physical examination and listen to your heart and lungs. He or she may also order breathing tests, allergy tests, blood  tests, and chest and sinus X-rays. The tests will find out if you do have asthma and if there are any other conditions that are contributing factors.  How is asthma treated? Asthma can be controlled, but not cured. It is not normal to have frequent symptoms, trouble sleeping, or trouble completing tasks. Appropriate asthma care will prevent symptoms and visits to the emergency room and hospital. Asthma medicines are one of the mainstays of asthma treatment. The drugs used to treat asthma are explained below.  Anti-inflammatories: These are the most important drugs for most people with asthma. Anti-inflammatory drugs reduce swelling and mucus production in the airways. As a result, airways are less sensitive and less likely to react to triggers. These medications need to be taken daily and may need to be taken for several weeks before they begin to control asthma. Anti-inflammatory medicines lead to fewer symptoms, better airflow, less sensitive airways, less airway damage, and fewer asthma attacks. If taken every day, they CONTROL or prevent asthma symptoms.   Bronchodilators: These drugs relax the  muscle bands that tighten around the airways. This action opens the airways, letting more air in and out of the lungs and improving breathing. Bronchodilators also help clear mucus from the lungs. As the airways open, the mucus moves more freely and can be coughed out more easily. In short-acting forms, bronchodilators RELIEVE or stop asthma symptoms by quickly opening the airways and are very helpful during an asthma episode. In long-acting forms, bronchodilators provide CONTROL of asthma symptoms and prevent asthma episodes.  Asthma drugs can be taken in a variety of ways. Inhaling the medications by using a metered dose inhaler, dry powder inhaler, or nebulizer is one way of taking asthma medicines. Oral medicines (pills or liquids you swallow) may also be prescribed.  Asthma severity Asthma is classified as  either "intermittent" (comes and goes) or "persistent" (lasting). Persistent asthma is further described as being mild, moderate, or severe. The severity of asthma is based on how often you have symptoms both during the day and night, as well as by the results of lung function tests and by how well you can perform activities. The "severity" of asthma refers to how "intense" or "strong" your asthma is.  Asthma control Asthma control is the goal of asthma treatment. Regardless of your asthma severity, it may or may not be controlled. Asthma control means: . You are able to do everything you want to do at work and home  . You have no (or minimal) asthma symptoms  . You do not wake up from your sleep or earlier than usual in the morning due to asthma  . You rarely need to use your reliever medicine (inhaler)  Another major part of your treatment is that you are happy with your asthma care and believe your asthma is controlled.  Monitoring symptoms A key part of treatment is keeping track of how well your lungs are working. Monitoring your symptoms  what they are, how and when they happen, and how severe they are  is an important part of being able to control your asthma.  Sometimes asthma is monitored using a peak flow meter. A peak flow (PF) meter measures how fast the air comes out of your lungs. It can help you know when your asthma is getting worse, sometimes even before you have symptoms. By taking daily peak flow readings, you can learn when to adjust medications to keep asthma under good control. It is also used to create your asthma action plan (see below). Your doctor can use your peak flow readings to adjust your treatment plan in some cases.  Asthma Action Plan Based on your history and asthma severity, you and your doctor will develop a care plan called an "asthma action plan." The asthma action plan describes when and how to use your medicines, actions to take when asthma worsens, and  when to seek emergency care. Make sure you understand this plan. If you do not, ask your asthma care provider any questions you may have. Your asthma action plan is one of the keys to controlling asthma. Keep it readily available to remind you of what you need to do every day to control asthma and what you need to do when symptoms occur.  Goals of asthma therapy These are the goals of asthma treatment: . Live an active, normal life  . Prevent chronic and troublesome symptoms  . Attend work or school every day  . Perform daily activities without difficulty  . Stop urgent visits to the doctor, emergency department,  or hospital  . Use and adjust medications to control asthma with few or no side effects

## 2020-04-04 NOTE — Progress Notes (Signed)
Carl Flynn    [id number]    Feb 24, 1966  Primary Care Physician:[facility]  Referring Physician: Mitzi Hansen, NP [address]  [place] [address] Reason for Consultation: shortness of breath Date of Consultation: 04/04/2020  Chief complaint:   Chief Complaint  Patient presents with  . Consult    [facility] since september when he was exposed to mold while helping his mother move.      HPI: Carl Flynn is a 54 y.o. gentleman with history of childhood asthma.   His asthma symptoms resurged a few years ago.  Was helping his mom move and was exposed to a bag of moldy bread when he opened it to throw to animals. Since then has been short of breath. Was given prednisone and albuterol by his PA which initially helped.  Then he was told if it was mold then he should stop because that could make things worse.  He also has burning across his chest which has been going on for a while. Overall his symptoms are much better.   He does have wheezing at night which is faint. Triggers include strong chemicals and perfumes as well as wood burning fire.   Albulterol and singulair for asthma. Thinks he might have had PFTs many years ago. Never been hospitalized for his breathing. Has had two courses of prednisone for his breathing in the  Remote history of SPT - positive for bee stings, ragweed, tree pollens, some molds.   Currently using albuterol twice a week.    Social history:  Occupation: disabled for knee problems, back and neck. Mental health issues. Has worked previously as a Teaching laboratory technician and done Administrator, sports.  Exposures: lives at home independently, two dogs.  Smoking history: former smoker, hasn't smoked in over 30 years.   Social History   Occupational History  . Occupation: Disabled  Tobacco Use  . Smoking status: Former Smoker    Quit date: 1989    Years since quitting: 32.7  . Smokeless tobacco: Former Clinical biochemist  . Vaping Use:  Never used  Substance and Sexual Activity  . Alcohol use: Yes    Comment: 2-3 times per year  . Drug use: No  . Sexual activity: Not on file    Relevant family history:  Family History  Problem Relation Age of Onset  . CAD Mother 76       CABG x 4  . Stroke Father   . Atrial fibrillation Father   . Seizures Sister     Past Medical History:  Diagnosis Date  . Chest pain at rest   . Constipation   . Depression   . Exertional dyspnea   . Gout attack   . H/O degenerative disc disease   . HLD (hyperlipidemia)   . HTN (hypertension)   . Hx of scarlet fever    childhood - seizures  . Hypertriglyceridemia   . IBS (irritable bowel syndrome)   . Migraine   . Obesity   . Pseudoseizure   . Schizoaffective disorder [facility])     Past Surgical History:  Procedure Laterality Date  . UMBILICAL HERNIA REPAIR      Physical Exam: Blood pressure 120/80, pulse (!) 116, temperature 98 F (36.7 C), temperature source Oral, height 5' 7.75" (1.721 m), weight 238 lb 6 oz (108.1 kg), SpO2 97 %. Gen:      No acute distress ENT:  no nasal polyps, mucus membranes  moist Lungs:    No increased respiratory effort, symmetric chest wall excursion, clear to auscultation bilaterally, no wheezes or crackles CV:         Regular rate and rhythm; no murmurs, rubs, or gallops.  No pedal edema Abd:      + bowel sounds; soft, non-tender; no distension MSK: no acute synovitis of DIP or PIP joints, no mechanics hands.  Skin:      Warm and dry; no rashes Neuro: normal speech, no focal facial asymmetry Psych: alert and oriented x3, normal mood and affect   Data Reviewed/Medical Decision Making:  Independent interpretation of tests: Imaging: No chest imaging on file  PFTs: None on file Labs:  Lab Results  Component Value Date   NA 139 06/05/2019   K 4.4 06/05/2019   CL 100 06/05/2019   CO2 25 06/05/2019   Lab Results  Component Value Date   WBC 7.7 10/05/2016   HGB 15.3 10/05/2016   HCT 45.2  10/05/2016   MCV 85.9 10/05/2016   PLT 237 10/05/2016     Immunization status:   There is no immunization history on file for this patient.  . I reviewed prior external note(s) from PCP . I reviewed the result(s) of the labs and imaging as noted above.  . I have ordered PFTs   Assessment:  Mild persistent asthma   Plan/Recommendations:  Continue prn albuterol. His symptoms appear to be getting better. Provided reassurance that if he is improving, and is otherwise immune competent, it's very unlikely that his symptoms are related to an indolent fungal pneumonia. We discussed disease management and progression at length today for asthma.  If he starts using prn albuterol more often he may need a controller medication.   Return to Care: Return in about 6 months (around 10/03/2020).  Durel Salts, MD Pulmonary and Critical Care Medicine [facility] [phone]  CC: Mitzi Hansen, NP

## 2020-04-16 DIAGNOSIS — M5412 Radiculopathy, cervical region: Secondary | ICD-10-CM | POA: Diagnosis not present

## 2020-04-23 DIAGNOSIS — M65312 Trigger thumb, left thumb: Secondary | ICD-10-CM | POA: Diagnosis not present

## 2020-04-23 DIAGNOSIS — G5602 Carpal tunnel syndrome, left upper limb: Secondary | ICD-10-CM | POA: Diagnosis not present

## 2020-04-25 DIAGNOSIS — M545 Low back pain, unspecified: Secondary | ICD-10-CM | POA: Diagnosis not present

## 2020-04-25 DIAGNOSIS — M5412 Radiculopathy, cervical region: Secondary | ICD-10-CM | POA: Diagnosis not present

## 2020-04-29 DIAGNOSIS — M5412 Radiculopathy, cervical region: Secondary | ICD-10-CM | POA: Diagnosis not present

## 2020-05-02 ENCOUNTER — Other Ambulatory Visit: Payer: Self-pay

## 2020-05-02 ENCOUNTER — Other Ambulatory Visit: Payer: Self-pay | Admitting: *Deleted

## 2020-05-02 ENCOUNTER — Other Ambulatory Visit: Payer: Self-pay | Admitting: Internal Medicine

## 2020-05-02 ENCOUNTER — Ambulatory Visit (INDEPENDENT_AMBULATORY_CARE_PROVIDER_SITE_OTHER): Payer: Medicare PPO | Admitting: Internal Medicine

## 2020-05-02 DIAGNOSIS — J4530 Mild persistent asthma, uncomplicated: Secondary | ICD-10-CM | POA: Diagnosis not present

## 2020-05-02 DIAGNOSIS — R0600 Dyspnea, unspecified: Secondary | ICD-10-CM

## 2020-05-02 DIAGNOSIS — R0609 Other forms of dyspnea: Secondary | ICD-10-CM

## 2020-05-02 DIAGNOSIS — M5412 Radiculopathy, cervical region: Secondary | ICD-10-CM | POA: Diagnosis not present

## 2020-05-02 LAB — PULMONARY FUNCTION TEST
FEF 25-75 Pre: 4.41 L/sec
FEF2575-%Pred-Pre: 147 %
FEV1-%Pred-Pre: 99 %
FEV1-Pre: 3.43 L
FEV1FVC-%Pred-Pre: 110 %
FEV6-%Pred-Pre: 93 %
FEV6-Pre: 4.04 L
FEV6FVC-%Pred-Pre: 104 %
FVC-%Pred-Pre: 89 %
FVC-Pre: 4.04 L
Pre FEV1/FVC ratio: 85 %
Pre FEV6/FVC Ratio: 100 %

## 2020-05-02 LAB — POCT EXHALED NITRIC OXIDE: FeNO level (ppb): 22

## 2020-05-02 NOTE — Progress Notes (Addendum)
Spirometry/feno performed today. 

## 2020-05-06 DIAGNOSIS — M5416 Radiculopathy, lumbar region: Secondary | ICD-10-CM | POA: Diagnosis not present

## 2020-05-09 DIAGNOSIS — M5416 Radiculopathy, lumbar region: Secondary | ICD-10-CM | POA: Diagnosis not present

## 2020-05-13 DIAGNOSIS — M5416 Radiculopathy, lumbar region: Secondary | ICD-10-CM | POA: Diagnosis not present

## 2020-05-16 DIAGNOSIS — M542 Cervicalgia: Secondary | ICD-10-CM | POA: Diagnosis not present

## 2020-05-22 DIAGNOSIS — M542 Cervicalgia: Secondary | ICD-10-CM | POA: Diagnosis not present

## 2020-05-27 DIAGNOSIS — M5416 Radiculopathy, lumbar region: Secondary | ICD-10-CM | POA: Diagnosis not present

## 2020-05-27 DIAGNOSIS — M5412 Radiculopathy, cervical region: Secondary | ICD-10-CM | POA: Diagnosis not present

## 2020-05-30 DIAGNOSIS — M5416 Radiculopathy, lumbar region: Secondary | ICD-10-CM | POA: Diagnosis not present

## 2020-05-30 DIAGNOSIS — M5412 Radiculopathy, cervical region: Secondary | ICD-10-CM | POA: Diagnosis not present

## 2020-06-03 DIAGNOSIS — M542 Cervicalgia: Secondary | ICD-10-CM | POA: Diagnosis not present

## 2020-06-03 DIAGNOSIS — M5416 Radiculopathy, lumbar region: Secondary | ICD-10-CM | POA: Diagnosis not present

## 2020-06-12 DIAGNOSIS — M542 Cervicalgia: Secondary | ICD-10-CM | POA: Diagnosis not present

## 2020-06-25 DIAGNOSIS — M5416 Radiculopathy, lumbar region: Secondary | ICD-10-CM | POA: Diagnosis not present

## 2020-06-25 DIAGNOSIS — M5451 Vertebrogenic low back pain: Secondary | ICD-10-CM | POA: Diagnosis not present

## 2020-06-25 DIAGNOSIS — M5030 Other cervical disc degeneration, unspecified cervical region: Secondary | ICD-10-CM | POA: Diagnosis not present

## 2020-07-04 DIAGNOSIS — M65312 Trigger thumb, left thumb: Secondary | ICD-10-CM | POA: Diagnosis not present

## 2020-07-04 DIAGNOSIS — G5602 Carpal tunnel syndrome, left upper limb: Secondary | ICD-10-CM | POA: Diagnosis not present

## 2020-07-15 DIAGNOSIS — Z20822 Contact with and (suspected) exposure to covid-19: Secondary | ICD-10-CM | POA: Diagnosis not present

## 2020-07-29 DIAGNOSIS — M5416 Radiculopathy, lumbar region: Secondary | ICD-10-CM | POA: Diagnosis not present

## 2020-07-29 DIAGNOSIS — M5412 Radiculopathy, cervical region: Secondary | ICD-10-CM | POA: Diagnosis not present

## 2020-08-12 DIAGNOSIS — M5412 Radiculopathy, cervical region: Secondary | ICD-10-CM | POA: Diagnosis not present

## 2020-08-12 DIAGNOSIS — M5416 Radiculopathy, lumbar region: Secondary | ICD-10-CM | POA: Diagnosis not present

## 2020-08-14 DIAGNOSIS — M5416 Radiculopathy, lumbar region: Secondary | ICD-10-CM | POA: Diagnosis not present

## 2020-08-14 DIAGNOSIS — M5412 Radiculopathy, cervical region: Secondary | ICD-10-CM | POA: Diagnosis not present

## 2020-08-19 DIAGNOSIS — M5416 Radiculopathy, lumbar region: Secondary | ICD-10-CM | POA: Diagnosis not present

## 2020-08-19 DIAGNOSIS — M5412 Radiculopathy, cervical region: Secondary | ICD-10-CM | POA: Diagnosis not present

## 2020-08-21 DIAGNOSIS — M5412 Radiculopathy, cervical region: Secondary | ICD-10-CM | POA: Diagnosis not present

## 2020-08-28 DIAGNOSIS — M5030 Other cervical disc degeneration, unspecified cervical region: Secondary | ICD-10-CM | POA: Diagnosis not present

## 2020-08-28 DIAGNOSIS — M5412 Radiculopathy, cervical region: Secondary | ICD-10-CM | POA: Diagnosis not present

## 2020-09-02 DIAGNOSIS — M65312 Trigger thumb, left thumb: Secondary | ICD-10-CM | POA: Diagnosis not present

## 2020-09-02 DIAGNOSIS — G5602 Carpal tunnel syndrome, left upper limb: Secondary | ICD-10-CM | POA: Diagnosis not present

## 2020-09-11 DIAGNOSIS — F251 Schizoaffective disorder, depressive type: Secondary | ICD-10-CM | POA: Diagnosis not present

## 2020-09-11 DIAGNOSIS — F603 Borderline personality disorder: Secondary | ICD-10-CM | POA: Diagnosis not present

## 2020-09-13 DIAGNOSIS — G5602 Carpal tunnel syndrome, left upper limb: Secondary | ICD-10-CM | POA: Diagnosis not present

## 2020-09-13 DIAGNOSIS — M65312 Trigger thumb, left thumb: Secondary | ICD-10-CM | POA: Diagnosis not present

## 2020-09-13 DIAGNOSIS — Z4789 Encounter for other orthopedic aftercare: Secondary | ICD-10-CM | POA: Diagnosis not present

## 2020-09-20 DIAGNOSIS — M5416 Radiculopathy, lumbar region: Secondary | ICD-10-CM | POA: Diagnosis not present

## 2020-09-28 DIAGNOSIS — M542 Cervicalgia: Secondary | ICD-10-CM | POA: Diagnosis not present

## 2020-10-09 DIAGNOSIS — F251 Schizoaffective disorder, depressive type: Secondary | ICD-10-CM | POA: Diagnosis not present

## 2020-10-09 DIAGNOSIS — F603 Borderline personality disorder: Secondary | ICD-10-CM | POA: Diagnosis not present

## 2020-10-15 DIAGNOSIS — G5602 Carpal tunnel syndrome, left upper limb: Secondary | ICD-10-CM | POA: Diagnosis not present

## 2020-10-15 DIAGNOSIS — M654 Radial styloid tenosynovitis [de Quervain]: Secondary | ICD-10-CM | POA: Diagnosis not present

## 2020-10-15 DIAGNOSIS — M65312 Trigger thumb, left thumb: Secondary | ICD-10-CM | POA: Diagnosis not present

## 2020-10-15 DIAGNOSIS — Z4789 Encounter for other orthopedic aftercare: Secondary | ICD-10-CM | POA: Diagnosis not present

## 2020-10-25 DIAGNOSIS — K219 Gastro-esophageal reflux disease without esophagitis: Secondary | ICD-10-CM | POA: Diagnosis not present

## 2020-10-30 DIAGNOSIS — M5412 Radiculopathy, cervical region: Secondary | ICD-10-CM | POA: Diagnosis not present

## 2020-11-01 DIAGNOSIS — M654 Radial styloid tenosynovitis [de Quervain]: Secondary | ICD-10-CM | POA: Diagnosis not present

## 2020-11-01 DIAGNOSIS — M65312 Trigger thumb, left thumb: Secondary | ICD-10-CM | POA: Diagnosis not present

## 2020-11-01 DIAGNOSIS — G5602 Carpal tunnel syndrome, left upper limb: Secondary | ICD-10-CM | POA: Diagnosis not present

## 2020-11-01 DIAGNOSIS — Z4789 Encounter for other orthopedic aftercare: Secondary | ICD-10-CM | POA: Diagnosis not present

## 2020-11-04 DIAGNOSIS — M5412 Radiculopathy, cervical region: Secondary | ICD-10-CM | POA: Diagnosis not present

## 2020-11-08 DIAGNOSIS — M5412 Radiculopathy, cervical region: Secondary | ICD-10-CM | POA: Diagnosis not present

## 2020-11-13 DIAGNOSIS — M5412 Radiculopathy, cervical region: Secondary | ICD-10-CM | POA: Diagnosis not present

## 2020-11-14 DIAGNOSIS — M9901 Segmental and somatic dysfunction of cervical region: Secondary | ICD-10-CM | POA: Diagnosis not present

## 2020-11-14 DIAGNOSIS — M9905 Segmental and somatic dysfunction of pelvic region: Secondary | ICD-10-CM | POA: Diagnosis not present

## 2020-11-14 DIAGNOSIS — M9903 Segmental and somatic dysfunction of lumbar region: Secondary | ICD-10-CM | POA: Diagnosis not present

## 2020-11-14 DIAGNOSIS — M531 Cervicobrachial syndrome: Secondary | ICD-10-CM | POA: Diagnosis not present

## 2020-11-21 DIAGNOSIS — M531 Cervicobrachial syndrome: Secondary | ICD-10-CM | POA: Diagnosis not present

## 2020-11-21 DIAGNOSIS — M9903 Segmental and somatic dysfunction of lumbar region: Secondary | ICD-10-CM | POA: Diagnosis not present

## 2020-11-21 DIAGNOSIS — M9905 Segmental and somatic dysfunction of pelvic region: Secondary | ICD-10-CM | POA: Diagnosis not present

## 2020-11-21 DIAGNOSIS — M9901 Segmental and somatic dysfunction of cervical region: Secondary | ICD-10-CM | POA: Diagnosis not present

## 2020-11-22 DIAGNOSIS — M5412 Radiculopathy, cervical region: Secondary | ICD-10-CM | POA: Diagnosis not present

## 2020-11-27 DIAGNOSIS — M5412 Radiculopathy, cervical region: Secondary | ICD-10-CM | POA: Diagnosis not present

## 2020-11-28 DIAGNOSIS — M9905 Segmental and somatic dysfunction of pelvic region: Secondary | ICD-10-CM | POA: Diagnosis not present

## 2020-11-28 DIAGNOSIS — M531 Cervicobrachial syndrome: Secondary | ICD-10-CM | POA: Diagnosis not present

## 2020-11-28 DIAGNOSIS — M9903 Segmental and somatic dysfunction of lumbar region: Secondary | ICD-10-CM | POA: Diagnosis not present

## 2020-11-28 DIAGNOSIS — M9901 Segmental and somatic dysfunction of cervical region: Secondary | ICD-10-CM | POA: Diagnosis not present

## 2020-11-29 DIAGNOSIS — M5412 Radiculopathy, cervical region: Secondary | ICD-10-CM | POA: Diagnosis not present

## 2020-12-04 DIAGNOSIS — F251 Schizoaffective disorder, depressive type: Secondary | ICD-10-CM | POA: Diagnosis not present

## 2020-12-04 DIAGNOSIS — I10 Essential (primary) hypertension: Secondary | ICD-10-CM | POA: Diagnosis not present

## 2020-12-04 DIAGNOSIS — S80861A Insect bite (nonvenomous), right lower leg, initial encounter: Secondary | ICD-10-CM | POA: Diagnosis not present

## 2020-12-04 DIAGNOSIS — W57XXXA Bitten or stung by nonvenomous insect and other nonvenomous arthropods, initial encounter: Secondary | ICD-10-CM | POA: Diagnosis not present

## 2020-12-04 DIAGNOSIS — R52 Pain, unspecified: Secondary | ICD-10-CM | POA: Diagnosis not present

## 2020-12-04 DIAGNOSIS — E785 Hyperlipidemia, unspecified: Secondary | ICD-10-CM | POA: Diagnosis not present

## 2020-12-04 DIAGNOSIS — F603 Borderline personality disorder: Secondary | ICD-10-CM | POA: Diagnosis not present

## 2020-12-04 DIAGNOSIS — J4531 Mild persistent asthma with (acute) exacerbation: Secondary | ICD-10-CM | POA: Diagnosis not present

## 2020-12-04 DIAGNOSIS — S30862A Insect bite (nonvenomous) of penis, initial encounter: Secondary | ICD-10-CM | POA: Diagnosis not present

## 2020-12-06 DIAGNOSIS — M5416 Radiculopathy, lumbar region: Secondary | ICD-10-CM | POA: Diagnosis not present

## 2020-12-09 DIAGNOSIS — M5416 Radiculopathy, lumbar region: Secondary | ICD-10-CM | POA: Diagnosis not present

## 2020-12-09 DIAGNOSIS — M5412 Radiculopathy, cervical region: Secondary | ICD-10-CM | POA: Diagnosis not present

## 2020-12-18 DIAGNOSIS — F603 Borderline personality disorder: Secondary | ICD-10-CM | POA: Diagnosis not present

## 2020-12-18 DIAGNOSIS — F251 Schizoaffective disorder, depressive type: Secondary | ICD-10-CM | POA: Diagnosis not present

## 2020-12-19 DIAGNOSIS — M25531 Pain in right wrist: Secondary | ICD-10-CM | POA: Diagnosis not present

## 2020-12-20 DIAGNOSIS — M546 Pain in thoracic spine: Secondary | ICD-10-CM | POA: Diagnosis not present

## 2020-12-26 DIAGNOSIS — M654 Radial styloid tenosynovitis [de Quervain]: Secondary | ICD-10-CM | POA: Diagnosis not present

## 2021-01-01 DIAGNOSIS — F603 Borderline personality disorder: Secondary | ICD-10-CM | POA: Diagnosis not present

## 2021-01-01 DIAGNOSIS — F251 Schizoaffective disorder, depressive type: Secondary | ICD-10-CM | POA: Diagnosis not present

## 2021-01-08 DIAGNOSIS — M546 Pain in thoracic spine: Secondary | ICD-10-CM | POA: Diagnosis not present

## 2021-01-08 DIAGNOSIS — M5416 Radiculopathy, lumbar region: Secondary | ICD-10-CM | POA: Diagnosis not present

## 2021-01-17 DIAGNOSIS — M546 Pain in thoracic spine: Secondary | ICD-10-CM | POA: Diagnosis not present

## 2021-01-17 DIAGNOSIS — M5416 Radiculopathy, lumbar region: Secondary | ICD-10-CM | POA: Diagnosis not present

## 2021-01-24 DIAGNOSIS — M546 Pain in thoracic spine: Secondary | ICD-10-CM | POA: Diagnosis not present

## 2021-01-24 DIAGNOSIS — M5416 Radiculopathy, lumbar region: Secondary | ICD-10-CM | POA: Diagnosis not present

## 2021-01-30 DIAGNOSIS — F251 Schizoaffective disorder, depressive type: Secondary | ICD-10-CM | POA: Diagnosis not present

## 2021-01-30 DIAGNOSIS — F603 Borderline personality disorder: Secondary | ICD-10-CM | POA: Diagnosis not present

## 2021-01-31 DIAGNOSIS — F251 Schizoaffective disorder, depressive type: Secondary | ICD-10-CM | POA: Diagnosis not present

## 2021-01-31 DIAGNOSIS — F603 Borderline personality disorder: Secondary | ICD-10-CM | POA: Diagnosis not present

## 2021-02-13 DIAGNOSIS — M545 Low back pain, unspecified: Secondary | ICD-10-CM | POA: Diagnosis not present

## 2021-02-13 DIAGNOSIS — M5136 Other intervertebral disc degeneration, lumbar region: Secondary | ICD-10-CM | POA: Diagnosis not present

## 2021-02-13 DIAGNOSIS — M7918 Myalgia, other site: Secondary | ICD-10-CM | POA: Diagnosis not present

## 2021-02-19 DIAGNOSIS — M546 Pain in thoracic spine: Secondary | ICD-10-CM | POA: Diagnosis not present

## 2021-02-26 DIAGNOSIS — M546 Pain in thoracic spine: Secondary | ICD-10-CM | POA: Diagnosis not present

## 2021-02-28 DIAGNOSIS — M546 Pain in thoracic spine: Secondary | ICD-10-CM | POA: Diagnosis not present

## 2021-02-28 DIAGNOSIS — M5416 Radiculopathy, lumbar region: Secondary | ICD-10-CM | POA: Diagnosis not present

## 2021-03-05 DIAGNOSIS — M5412 Radiculopathy, cervical region: Secondary | ICD-10-CM | POA: Diagnosis not present

## 2021-03-05 DIAGNOSIS — M5416 Radiculopathy, lumbar region: Secondary | ICD-10-CM | POA: Diagnosis not present

## 2021-03-07 DIAGNOSIS — M546 Pain in thoracic spine: Secondary | ICD-10-CM | POA: Diagnosis not present

## 2021-03-07 DIAGNOSIS — Z0000 Encounter for general adult medical examination without abnormal findings: Secondary | ICD-10-CM | POA: Diagnosis not present

## 2021-03-07 DIAGNOSIS — Z1211 Encounter for screening for malignant neoplasm of colon: Secondary | ICD-10-CM | POA: Diagnosis not present

## 2021-03-07 DIAGNOSIS — E785 Hyperlipidemia, unspecified: Secondary | ICD-10-CM | POA: Diagnosis not present

## 2021-03-07 DIAGNOSIS — I10 Essential (primary) hypertension: Secondary | ICD-10-CM | POA: Diagnosis not present

## 2021-03-07 DIAGNOSIS — K219 Gastro-esophageal reflux disease without esophagitis: Secondary | ICD-10-CM | POA: Diagnosis not present

## 2021-03-07 DIAGNOSIS — F332 Major depressive disorder, recurrent severe without psychotic features: Secondary | ICD-10-CM | POA: Diagnosis not present

## 2021-03-07 DIAGNOSIS — J4531 Mild persistent asthma with (acute) exacerbation: Secondary | ICD-10-CM | POA: Diagnosis not present

## 2021-03-07 DIAGNOSIS — Z125 Encounter for screening for malignant neoplasm of prostate: Secondary | ICD-10-CM | POA: Diagnosis not present

## 2021-03-07 DIAGNOSIS — Z136 Encounter for screening for cardiovascular disorders: Secondary | ICD-10-CM | POA: Diagnosis not present

## 2021-03-10 DIAGNOSIS — M5416 Radiculopathy, lumbar region: Secondary | ICD-10-CM | POA: Diagnosis not present

## 2021-03-13 DIAGNOSIS — M546 Pain in thoracic spine: Secondary | ICD-10-CM | POA: Diagnosis not present

## 2021-03-19 DIAGNOSIS — M546 Pain in thoracic spine: Secondary | ICD-10-CM | POA: Diagnosis not present

## 2021-03-21 DIAGNOSIS — M546 Pain in thoracic spine: Secondary | ICD-10-CM | POA: Diagnosis not present

## 2021-03-27 DIAGNOSIS — F603 Borderline personality disorder: Secondary | ICD-10-CM | POA: Diagnosis not present

## 2021-03-27 DIAGNOSIS — F251 Schizoaffective disorder, depressive type: Secondary | ICD-10-CM | POA: Diagnosis not present

## 2021-04-02 DIAGNOSIS — I10 Essential (primary) hypertension: Secondary | ICD-10-CM | POA: Diagnosis not present

## 2021-04-02 DIAGNOSIS — E785 Hyperlipidemia, unspecified: Secondary | ICD-10-CM | POA: Diagnosis not present

## 2021-04-02 DIAGNOSIS — N410 Acute prostatitis: Secondary | ICD-10-CM | POA: Diagnosis not present

## 2021-04-02 DIAGNOSIS — R300 Dysuria: Secondary | ICD-10-CM | POA: Diagnosis not present

## 2021-05-02 DIAGNOSIS — R3915 Urgency of urination: Secondary | ICD-10-CM | POA: Diagnosis not present

## 2021-05-12 DIAGNOSIS — R351 Nocturia: Secondary | ICD-10-CM | POA: Diagnosis not present

## 2021-05-12 DIAGNOSIS — N401 Enlarged prostate with lower urinary tract symptoms: Secondary | ICD-10-CM | POA: Diagnosis not present

## 2021-05-12 DIAGNOSIS — R3912 Poor urinary stream: Secondary | ICD-10-CM | POA: Diagnosis not present

## 2021-05-12 DIAGNOSIS — R3914 Feeling of incomplete bladder emptying: Secondary | ICD-10-CM | POA: Diagnosis not present

## 2021-05-20 DIAGNOSIS — F251 Schizoaffective disorder, depressive type: Secondary | ICD-10-CM | POA: Diagnosis not present

## 2021-05-20 DIAGNOSIS — F603 Borderline personality disorder: Secondary | ICD-10-CM | POA: Diagnosis not present

## 2021-06-03 DIAGNOSIS — F603 Borderline personality disorder: Secondary | ICD-10-CM | POA: Diagnosis not present

## 2021-06-03 DIAGNOSIS — F251 Schizoaffective disorder, depressive type: Secondary | ICD-10-CM | POA: Diagnosis not present

## 2021-06-03 DIAGNOSIS — I10 Essential (primary) hypertension: Secondary | ICD-10-CM | POA: Diagnosis not present

## 2021-06-25 DIAGNOSIS — F603 Borderline personality disorder: Secondary | ICD-10-CM | POA: Diagnosis not present

## 2021-06-25 DIAGNOSIS — F251 Schizoaffective disorder, depressive type: Secondary | ICD-10-CM | POA: Diagnosis not present

## 2021-06-26 DIAGNOSIS — R42 Dizziness and giddiness: Secondary | ICD-10-CM | POA: Diagnosis not present

## 2021-07-04 DIAGNOSIS — R351 Nocturia: Secondary | ICD-10-CM | POA: Diagnosis not present

## 2021-07-04 DIAGNOSIS — R3912 Poor urinary stream: Secondary | ICD-10-CM | POA: Diagnosis not present

## 2021-07-04 DIAGNOSIS — R3914 Feeling of incomplete bladder emptying: Secondary | ICD-10-CM | POA: Diagnosis not present

## 2021-07-04 DIAGNOSIS — N401 Enlarged prostate with lower urinary tract symptoms: Secondary | ICD-10-CM | POA: Diagnosis not present

## 2021-07-16 DIAGNOSIS — I10 Essential (primary) hypertension: Secondary | ICD-10-CM | POA: Diagnosis not present

## 2021-07-16 DIAGNOSIS — R590 Localized enlarged lymph nodes: Secondary | ICD-10-CM | POA: Diagnosis not present

## 2021-07-23 DIAGNOSIS — F251 Schizoaffective disorder, depressive type: Secondary | ICD-10-CM | POA: Diagnosis not present

## 2021-07-23 DIAGNOSIS — F603 Borderline personality disorder: Secondary | ICD-10-CM | POA: Diagnosis not present

## 2021-08-01 DIAGNOSIS — R3912 Poor urinary stream: Secondary | ICD-10-CM | POA: Diagnosis not present

## 2021-08-01 DIAGNOSIS — Z125 Encounter for screening for malignant neoplasm of prostate: Secondary | ICD-10-CM | POA: Diagnosis not present

## 2021-08-01 DIAGNOSIS — R3914 Feeling of incomplete bladder emptying: Secondary | ICD-10-CM | POA: Diagnosis not present

## 2021-08-05 ENCOUNTER — Other Ambulatory Visit: Payer: Self-pay | Admitting: Urology

## 2021-08-05 DIAGNOSIS — R590 Localized enlarged lymph nodes: Secondary | ICD-10-CM | POA: Diagnosis not present

## 2021-08-05 DIAGNOSIS — I10 Essential (primary) hypertension: Secondary | ICD-10-CM | POA: Diagnosis not present

## 2021-08-12 ENCOUNTER — Encounter (HOSPITAL_BASED_OUTPATIENT_CLINIC_OR_DEPARTMENT_OTHER): Payer: Self-pay | Admitting: Urology

## 2021-08-12 ENCOUNTER — Other Ambulatory Visit: Payer: Self-pay

## 2021-08-12 NOTE — Progress Notes (Addendum)
Spoke w/ via phone for pre-op interview--- Carl Flynn needs dos---- EKG and ISTAT              Lab results------ COVID test -----patient states asymptomatic no test needed Arrive at -------1030 NPO after MN NO Solid Food.  Clear liquids from MN until---0930 Med rec completed Medications to take morning of surgery -----Bring Albuterol inhaler. Diabetic medication ----- Patient instructed no nail polish to be worn day of surgery Patient instructed to bring photo id and insurance card day of surgery Patient aware to have Driver (ride ) / caregiver   [name] [name]  for 24 hours after surgery  Patient Special Instructions ----- Pre-Op special Istructions ----- Patient verbalized understanding of instructions that were given at this phone interview. Patient denies shortness of breath, chest pain, fever, cough at this phone interview.   Reviewed [facility] guidelines with patient including discharge will be by 9am the next morning. Patient verbalized understanding.

## 2021-08-19 ENCOUNTER — Encounter (HOSPITAL_BASED_OUTPATIENT_CLINIC_OR_DEPARTMENT_OTHER): Payer: Self-pay | Admitting: Urology

## 2021-08-19 ENCOUNTER — Other Ambulatory Visit: Payer: Self-pay

## 2021-08-19 ENCOUNTER — Ambulatory Visit (HOSPITAL_BASED_OUTPATIENT_CLINIC_OR_DEPARTMENT_OTHER)
Admission: RE | Admit: 2021-08-19 | Discharge: 2021-08-20 | Disposition: A | Discharge status: Home / Self Care | Payer: Medicare PPO | Attending: Urology | Admitting: Urology

## 2021-08-19 ENCOUNTER — Ambulatory Visit (HOSPITAL_BASED_OUTPATIENT_CLINIC_OR_DEPARTMENT_OTHER): Payer: Medicare PPO | Admitting: Anesthesiology

## 2021-08-19 ENCOUNTER — Encounter (HOSPITAL_BASED_OUTPATIENT_CLINIC_OR_DEPARTMENT_OTHER)
Admission: RE | Disposition: A | Discharge status: Home / Self Care | Payer: Self-pay | Source: Home / Self Care | Attending: Urology

## 2021-08-19 DIAGNOSIS — R338 Other retention of urine: Secondary | ICD-10-CM | POA: Diagnosis not present

## 2021-08-19 DIAGNOSIS — N400 Enlarged prostate without lower urinary tract symptoms: Secondary | ICD-10-CM | POA: Diagnosis not present

## 2021-08-19 DIAGNOSIS — N138 Other obstructive and reflux uropathy: Secondary | ICD-10-CM | POA: Diagnosis not present

## 2021-08-19 DIAGNOSIS — R3912 Poor urinary stream: Secondary | ICD-10-CM | POA: Insufficient documentation

## 2021-08-19 DIAGNOSIS — N401 Enlarged prostate with lower urinary tract symptoms: Secondary | ICD-10-CM | POA: Diagnosis present

## 2021-08-19 DIAGNOSIS — R351 Nocturia: Secondary | ICD-10-CM | POA: Insufficient documentation

## 2021-08-19 DIAGNOSIS — R3914 Feeling of incomplete bladder emptying: Secondary | ICD-10-CM | POA: Diagnosis not present

## 2021-08-19 DIAGNOSIS — I10 Essential (primary) hypertension: Secondary | ICD-10-CM | POA: Insufficient documentation

## 2021-08-19 DIAGNOSIS — N320 Bladder-neck obstruction: Secondary | ICD-10-CM | POA: Diagnosis not present

## 2021-08-19 DIAGNOSIS — Z79899 Other long term (current) drug therapy: Secondary | ICD-10-CM | POA: Insufficient documentation

## 2021-08-19 DIAGNOSIS — F32A Depression, unspecified: Secondary | ICD-10-CM | POA: Insufficient documentation

## 2021-08-19 DIAGNOSIS — J45909 Unspecified asthma, uncomplicated: Secondary | ICD-10-CM | POA: Insufficient documentation

## 2021-08-19 DIAGNOSIS — Z62810 Personal history of physical and sexual abuse in childhood: Secondary | ICD-10-CM | POA: Insufficient documentation

## 2021-08-19 DIAGNOSIS — Z87891 Personal history of nicotine dependence: Secondary | ICD-10-CM | POA: Diagnosis not present

## 2021-08-19 HISTORY — PX: TRANSURETHRAL RESECTION OF PROSTATE: SHX73

## 2021-08-19 HISTORY — DX: Unspecified asthma, uncomplicated: J45.909

## 2021-08-19 LAB — POCT I-STAT, CHEM 8
BUN: 12 mg/dL (ref 6–20)
Calcium, Ion: 1.2 mmol/L (ref 1.15–1.40)
Chloride: 103 mmol/L (ref 98–111)
Creatinine, Ser: 0.9 mg/dL (ref 0.61–1.24)
Glucose, Bld: 103 mg/dL — ABNORMAL HIGH (ref 70–99)
HCT: 41 % (ref 39.0–52.0)
Hemoglobin: 13.9 g/dL (ref 13.0–17.0)
Potassium: 3.6 mmol/L (ref 3.5–5.1)
Sodium: 141 mmol/L (ref 135–145)
TCO2: 27 mmol/L (ref 22–32)

## 2021-08-19 SURGERY — TURP (TRANSURETHRAL RESECTION OF PROSTATE)
Anesthesia: General | Site: Prostate

## 2021-08-19 MED ORDER — LISINOPRIL 20 MG PO TABS
20.0000 mg | ORAL_TABLET | Freq: Every day | ORAL | Status: DC
Start: 2021-08-19 — End: 2021-08-20
  Filled 2021-08-19: qty 1

## 2021-08-19 MED ORDER — LACTATED RINGERS IV SOLN: INTRAVENOUS | Status: DC

## 2021-08-19 MED ORDER — CEFAZOLIN SODIUM-DEXTROSE 2-4 GM/100ML-% IV SOLN
2.0000 g | INTRAVENOUS | Status: AC
  Administered 2021-08-19: 2 g via INTRAVENOUS

## 2021-08-19 MED ORDER — PHENYLEPHRINE 40 MCG/ML (10ML) SYRINGE FOR IV PUSH (FOR BLOOD PRESSURE SUPPORT)
PREFILLED_SYRINGE | INTRAVENOUS | Status: AC
  Filled 2021-08-19: qty 10

## 2021-08-19 MED ORDER — FENTANYL CITRATE (PF) 100 MCG/2ML IJ SOLN
INTRAMUSCULAR | Status: DC | PRN
  Administered 2021-08-19 (×2): 50 ug via INTRAVENOUS

## 2021-08-19 MED ORDER — ACETAMINOPHEN 500 MG PO TABS
ORAL_TABLET | ORAL | Status: AC
  Filled 2021-08-19: qty 2

## 2021-08-19 MED ORDER — SENNOSIDES-DOCUSATE SODIUM 8.6-50 MG PO TABS
2.0000 | ORAL_TABLET | Freq: Every day | ORAL | Status: DC
  Administered 2021-08-19: 2 via ORAL
  Filled 2021-08-19: qty 2

## 2021-08-19 MED ORDER — SIMVASTATIN 20 MG PO TABS
20.0000 mg | ORAL_TABLET | Freq: Every day | ORAL | Status: DC
Start: 2021-08-19 — End: 2021-08-20
  Filled 2021-08-19: qty 1

## 2021-08-19 MED ORDER — PROPOFOL 10 MG/ML IV BOLUS
INTRAVENOUS | Status: AC
  Filled 2021-08-19: qty 20

## 2021-08-19 MED ORDER — MORPHINE SULFATE (PF) 2 MG/ML IV SOLN
INTRAVENOUS | Status: AC
  Filled 2021-08-19: qty 1

## 2021-08-19 MED ORDER — ALBUTEROL SULFATE HFA 108 (90 BASE) MCG/ACT IN AERS: 2.0000 | INHALATION_SPRAY | Freq: Four times a day (QID) | RESPIRATORY_TRACT | Status: DC | PRN

## 2021-08-19 MED ORDER — DIPHENHYDRAMINE HCL 12.5 MG/5ML PO ELIX: 12.5000 mg | ORAL_SOLUTION | Freq: Four times a day (QID) | ORAL | Status: DC | PRN

## 2021-08-19 MED ORDER — ONDANSETRON HCL 4 MG/2ML IJ SOLN
INTRAMUSCULAR | Status: DC | PRN
Start: 2021-08-19 — End: 2021-08-19
  Administered 2021-08-19: 4 mg via INTRAVENOUS

## 2021-08-19 MED ORDER — CEFAZOLIN SODIUM-DEXTROSE 2-4 GM/100ML-% IV SOLN
2.0000 g | Freq: Three times a day (TID) | INTRAVENOUS | Status: AC
  Administered 2021-08-19 (×2): 2 g via INTRAVENOUS

## 2021-08-19 MED ORDER — VENLAFAXINE HCL ER 37.5 MG PO CP24
37.5000 mg | ORAL_CAPSULE | Freq: Every day | ORAL | Status: DC
  Filled 2021-08-19: qty 1

## 2021-08-19 MED ORDER — MORPHINE SULFATE (PF) 4 MG/ML IV SOLN
INTRAVENOUS | Status: AC
  Filled 2021-08-19: qty 1

## 2021-08-19 MED ORDER — CEFAZOLIN SODIUM-DEXTROSE 2-4 GM/100ML-% IV SOLN
INTRAVENOUS | Status: AC
  Filled 2021-08-19: qty 100

## 2021-08-19 MED ORDER — ACETAMINOPHEN 325 MG PO TABS
ORAL_TABLET | ORAL | Status: AC
  Filled 2021-08-19: qty 2

## 2021-08-19 MED ORDER — DEXTROSE-NACL 5-0.45 % IV SOLN: INTRAVENOUS | Status: DC

## 2021-08-19 MED ORDER — PHENYLEPHRINE 40 MCG/ML (10ML) SYRINGE FOR IV PUSH (FOR BLOOD PRESSURE SUPPORT)
PREFILLED_SYRINGE | INTRAVENOUS | Status: DC | PRN
  Administered 2021-08-19: 80 ug via INTRAVENOUS

## 2021-08-19 MED ORDER — PROPOFOL 10 MG/ML IV BOLUS
INTRAVENOUS | Status: DC | PRN
  Administered 2021-08-19: 100 mg via INTRAVENOUS
  Administered 2021-08-19 (×2): 50 mg via INTRAVENOUS
  Administered 2021-08-19: 150 mg via INTRAVENOUS

## 2021-08-19 MED ORDER — AMISULPRIDE (ANTIEMETIC) 5 MG/2ML IV SOLN: 10.0000 mg | Freq: Once | INTRAVENOUS | Status: DC | PRN

## 2021-08-19 MED ORDER — EPHEDRINE 5 MG/ML INJ
INTRAVENOUS | Status: AC
  Filled 2021-08-19: qty 5

## 2021-08-19 MED ORDER — DIPHENHYDRAMINE HCL 50 MG/ML IJ SOLN: 12.5000 mg | Freq: Four times a day (QID) | INTRAMUSCULAR | Status: DC | PRN

## 2021-08-19 MED ORDER — ONDANSETRON HCL 4 MG/2ML IJ SOLN: 4.0000 mg | INTRAMUSCULAR | Status: DC | PRN

## 2021-08-19 MED ORDER — LIDOCAINE 2% (20 MG/ML) 5 ML SYRINGE
INTRAMUSCULAR | Status: DC | PRN
  Administered 2021-08-19: 30 mg via INTRAVENOUS

## 2021-08-19 MED ORDER — KETOROLAC TROMETHAMINE 30 MG/ML IJ SOLN: 30.0000 mg | Freq: Once | INTRAMUSCULAR | Status: AC | PRN

## 2021-08-19 MED ORDER — TRAZODONE HCL 100 MG PO TABS
100.0000 mg | ORAL_TABLET | Freq: Every day | ORAL | Status: DC
Start: 2021-08-19 — End: 2021-08-20
  Filled 2021-08-19: qty 1

## 2021-08-19 MED ORDER — ACETAMINOPHEN 500 MG PO TABS
1000.0000 mg | ORAL_TABLET | Freq: Once | ORAL | Status: AC
  Administered 2021-08-19: 1000 mg via ORAL

## 2021-08-19 MED ORDER — PROPOFOL 500 MG/50ML IV EMUL
INTRAVENOUS | Status: AC
  Filled 2021-08-19: qty 50

## 2021-08-19 MED ORDER — PROMETHAZINE HCL 25 MG/ML IJ SOLN: 6.2500 mg | INTRAMUSCULAR | Status: DC | PRN

## 2021-08-19 MED ORDER — FENTANYL CITRATE (PF) 100 MCG/2ML IJ SOLN
INTRAMUSCULAR | Status: AC
  Filled 2021-08-19: qty 2

## 2021-08-19 MED ORDER — ACETAMINOPHEN 325 MG PO TABS
650.0000 mg | ORAL_TABLET | ORAL | Status: DC | PRN
  Administered 2021-08-19 – 2021-08-20 (×4): 650 mg via ORAL

## 2021-08-19 MED ORDER — LISINOPRIL-HYDROCHLOROTHIAZIDE 20-12.5 MG PO TABS: 1.0000 | ORAL_TABLET | Freq: Every day | ORAL | Status: DC

## 2021-08-19 MED ORDER — MORPHINE SULFATE (PF) 4 MG/ML IV SOLN
2.0000 mg | INTRAVENOUS | Status: DC | PRN
  Administered 2021-08-19: 4 mg via INTRAVENOUS
  Administered 2021-08-19 (×2): 2 mg via INTRAVENOUS

## 2021-08-19 MED ORDER — BACITRACIN-NEOMYCIN-POLYMYXIN 400-5-5000 EX OINT: 1.0000 "application " | TOPICAL_OINTMENT | Freq: Three times a day (TID) | CUTANEOUS | Status: DC | PRN

## 2021-08-19 MED ORDER — HYDROCHLOROTHIAZIDE 12.5 MG PO TABS
12.5000 mg | ORAL_TABLET | Freq: Every day | ORAL | Status: DC
Start: 2021-08-19 — End: 2021-08-20
  Filled 2021-08-19: qty 1

## 2021-08-19 MED ORDER — BELLADONNA ALKALOIDS-OPIUM 16.2-60 MG RE SUPP
1.0000 | Freq: Four times a day (QID) | RECTAL | Status: DC | PRN
  Administered 2021-08-19: 1 via RECTAL
  Filled 2021-08-19 (×2): qty 1

## 2021-08-19 MED ORDER — FENTANYL CITRATE (PF) 100 MCG/2ML IJ SOLN
25.0000 ug | INTRAMUSCULAR | Status: DC | PRN
  Administered 2021-08-19: 50 ug via INTRAVENOUS
  Administered 2021-08-19 (×2): 25 ug via INTRAVENOUS

## 2021-08-19 MED ORDER — SODIUM CHLORIDE 0.9 % IR SOLN
Status: DC | PRN
  Administered 2021-08-19: 6000 mL
  Administered 2021-08-19: 3000 mL

## 2021-08-19 MED ORDER — DEXAMETHASONE SODIUM PHOSPHATE 10 MG/ML IJ SOLN
INTRAMUSCULAR | Status: DC | PRN
  Administered 2021-08-19: 5 mg via INTRAVENOUS

## 2021-08-19 MED ORDER — MIDAZOLAM HCL 2 MG/2ML IJ SOLN
INTRAMUSCULAR | Status: DC | PRN
Start: 2021-08-19 — End: 2021-08-19
  Administered 2021-08-19: 2 mg via INTRAVENOUS

## 2021-08-19 MED ORDER — MIDAZOLAM HCL 2 MG/2ML IJ SOLN
INTRAMUSCULAR | Status: AC
  Filled 2021-08-19: qty 2

## 2021-08-19 SURGICAL SUPPLY — 19 items
BAG DRAIN URO-CYSTO SKYTR STRL (DRAIN) ×2 IMPLANT
BAG DRN RND TRDRP ANRFLXCHMBR (UROLOGICAL SUPPLIES) ×1
BAG DRN UROCATH (DRAIN) ×1
BAG URINE DRAIN 2000ML AR STRL (UROLOGICAL SUPPLIES) ×2 IMPLANT
CATH FOLEY 3WAY 30CC 22FR (CATHETERS) ×2 IMPLANT
CATH HEMA 3WAY 30CC 22FR COUDE (CATHETERS) ×1 IMPLANT
CLOTH BEACON ORANGE TIMEOUT ST (SAFETY) ×2 IMPLANT
GLOVE SURG ENC MOIS LTX SZ6.5 (GLOVE) ×2 IMPLANT
GOWN STRL REUS W/TWL LRG LVL3 (GOWN DISPOSABLE) ×2 IMPLANT
HOLDER FOLEY CATH W/STRAP (MISCELLANEOUS) ×2 IMPLANT
IV NS IRRIG 3000ML ARTHROMATIC (IV SOLUTION) ×8 IMPLANT
KIT TURNOVER CYSTO (KITS) ×2 IMPLANT
LOOP CUT BIPOLAR 24F LRG (ELECTROSURGICAL) IMPLANT
MANIFOLD NEPTUNE II (INSTRUMENTS) ×2 IMPLANT
PACK CYSTO (CUSTOM PROCEDURE TRAY) ×2 IMPLANT
SYR TOOMEY IRRIG 70ML (MISCELLANEOUS) ×2
SYRINGE TOOMEY IRRIG 70ML (MISCELLANEOUS) ×1 IMPLANT
TUBE CONNECTING 12X1/4 (SUCTIONS) ×2 IMPLANT
TUBING UROLOGY SET (TUBING) ×2 IMPLANT

## 2021-08-19 NOTE — Anesthesia Preprocedure Evaluation (Addendum)
Anesthesia Evaluation  Patient identified by MRN, date of birth, ID band Patient awake    Reviewed: Allergy & Precautions, NPO status , Patient's Chart, lab work & pertinent test results  Airway Mallampati: I  TM Distance: >3 FB Neck ROM: Full    Dental  (+) Chipped, Poor Dentition   Pulmonary asthma , former smoker,    Pulmonary exam normal breath sounds clear to auscultation       Cardiovascular hypertension, Pt. on medications and Pt. on home beta blockers Normal cardiovascular exam Rhythm:Regular Rate:Normal     Neuro/Psych  Headaches, Seizures -,  PSYCHIATRIC DISORDERS Depression Schizophrenia    GI/Hepatic Neg liver ROS, IBS   Endo/Other  negative endocrine ROS  Renal/GU negative Renal ROS     Musculoskeletal negative musculoskeletal ROS (+)   Abdominal (+) + obese,   Peds  Hematology negative hematology ROS (+)   Anesthesia Other Findings BPH  Reproductive/Obstetrics                            Anesthesia Physical Anesthesia Plan  ASA: 2  Anesthesia Plan: General   Post-op Pain Management:    Induction: Intravenous  PONV Risk Score and Plan: 3 and Ondansetron, Dexamethasone, Midazolam and Treatment may vary due to age or medical condition  Airway Management Planned: LMA  Additional Equipment:   Intra-op Plan:   Post-operative Plan: Extubation in OR  Informed Consent: I have reviewed the patients History and Physical, chart, labs and discussed the procedure including the risks, benefits and alternatives for the proposed anesthesia with the patient or authorized representative who has indicated his/her understanding and acceptance.     Dental advisory given  Plan Discussed with: CRNA  Anesthesia Plan Comments:         Anesthesia Quick Evaluation

## 2021-08-19 NOTE — Interval H&P Note (Signed)
History and Physical Interval Note:  08/19/2021 11:17 AM  Arma Heading  has presented today for surgery, with the diagnosis of BPH.  The various methods of treatment have been discussed with the patient and family. After consideration of risks, benefits and other options for treatment, the patient has consented to  Procedure(s): TRANSURETHRAL RESECTION OF THE PROSTATE (TURP) (N/A) as a surgical intervention.  The patient's history has been reviewed, patient examined, no change in status, stable for surgery.  I have reviewed the patient's chart and labs.  Questions were answered to the patient's satisfaction.     Janet Decesare D Gianpaolo Mindel

## 2021-08-19 NOTE — Anesthesia Postprocedure Evaluation (Signed)
Anesthesia Post Note  Patient: Carl Flynn  Procedure(s) Performed: TRANSURETHRAL RESECTION OF THE PROSTATE (TURP) (Prostate)     Patient location during evaluation: PACU Anesthesia Type: General Level of consciousness: awake and alert Pain management: pain level controlled Vital Signs Assessment: post-procedure vital signs reviewed and stable Respiratory status: spontaneous breathing, nonlabored ventilation, respiratory function stable and patient connected to nasal cannula oxygen Cardiovascular status: blood pressure returned to baseline and stable Postop Assessment: no apparent nausea or vomiting Anesthetic complications: no   No notable events documented.  Last Vitals:  Vitals:   08/19/21 0959 08/19/21 1241  BP: 123/82 120/67  Pulse: 72 91  Resp: 16 14  Temp: 37 C (!) 36.3 C  SpO2: 99% (!) 84%    Last Pain:  Vitals:   08/19/21 1241  TempSrc:   PainSc: Asleep                 Quincy Prisco S
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
## 2021-08-19 NOTE — Discharge Instructions (Addendum)

## 2021-08-19 NOTE — Anesthesia Procedure Notes (Addendum)
Procedure Name: LMA Insertion Date/Time: 08/19/2021 11:51 AM Performed by: Francie Massing, CRNA Pre-anesthesia Checklist: Patient identified, Emergency Drugs available, Suction available and Patient being monitored Patient Re-evaluated:Patient Re-evaluated prior to induction Oxygen Delivery Method: Circle system utilized Preoxygenation: Pre-oxygenation with 100% oxygen Induction Type: IV induction Ventilation: Mask ventilation without difficulty LMA: LMA inserted LMA Size: 4.0 Number of attempts: 1 Airway Equipment and Method: Bite block Placement Confirmation: positive ETCO2 Tube secured with: Tape Dental Injury: Teeth and Oropharynx as per pre-operative assessment

## 2021-08-19 NOTE — Transfer of Care (Signed)
Immediate Anesthesia Transfer of Care Note  Patient: Carl Flynn  Procedure(s) Performed: TRANSURETHRAL RESECTION OF THE PROSTATE (TURP) (Prostate)  Patient Location: PACU  Anesthesia Type:General  Level of Consciousness: awake  Airway & Oxygen Therapy: Patient Spontanous Breathing and Patient connected to face mask oxygen  Post-op Assessment: Report given to RN and Post -op Vital signs reviewed and stable  Post vital signs: Reviewed and stable  Last Vitals:  Vitals Value Taken Time  BP 114/74 08/19/21 1245  Temp 36.3 C 08/19/21 1241  Pulse 103 08/19/21 1249  Resp 18 08/19/21 1249  SpO2 100 % 08/19/21 1249  Vitals shown include unvalidated device data.  Last Pain:  Vitals:   08/19/21 0959  TempSrc: Oral  PainSc: 5          Complications: No notable events documented.

## 2021-08-19 NOTE — Op Note (Signed)
Preoperative diagnosis: Bladder outlet obstruction secondary to BPH  Postoperative diagnosis:  Bladder outlet obstruction secondary to BPH  Procedure:  Cystoscopy Transurethral resection of the prostate  Surgeon: Kasandra Knudsen, M.D.  Anesthesia: General  Complications: None  EBL: Minimal  Specimens: Prostate chiops  Indication: Carl Flynn is a patient with bladder outlet obstruction secondary to benign prostatic hyperplasia. After reviewing the management options for treatment, he elected to proceed with the above surgical procedure(s). We have discussed the potential benefits and risks of the procedure, side effects of the proposed treatment, the likelihood of the patient achieving the goals of the procedure, and any potential problems that might occur during the procedure or recuperation. Informed consent has been obtained.  Description of procedure:  The patient was taken to the operating room and general anesthesia was induced.  The patient was placed in the dorsal lithotomy position, prepped and draped in the usual sterile fashion, and preoperative antibiotics were administered. A preoperative time-out was performed.   Cystourethroscopy was performed.  The patients urethra was examined and demonstrated bilobar prostatic hypertrophy.   The bladder was then systematically examined in its entirety. There was no evidence of any bladder tumors, stones, or other mucosal pathology.  The ureteral orifices were identified and marked so as to be avoided during the procedure.  The prostate adenoma was then resected utilizing loop cautery resection with the bipolar cutting loop.  The prostate adenoma from the bladder neck back to the verumontanum was resected beginning at the six o'clock position and then extended to include the right and left lobes of the prostate and anterior prostate. Care was taken not to resect distal to the verumontanum.  Hemostasis was then achieved with  the cautery and the bladder was emptied and reinspected with no significant bleeding noted at the end of the procedure.    A 22 French three-way way catheter was then placed into the bladder.  The patient appeared to tolerate the procedure well and without complications.  The patient was able to be awakened and transferred to the recovery unit in satisfactory condition.

## 2021-08-20 ENCOUNTER — Encounter (HOSPITAL_BASED_OUTPATIENT_CLINIC_OR_DEPARTMENT_OTHER): Payer: Self-pay | Admitting: Urology

## 2021-08-20 DIAGNOSIS — N401 Enlarged prostate with lower urinary tract symptoms: Secondary | ICD-10-CM | POA: Diagnosis not present

## 2021-08-20 LAB — SURGICAL PATHOLOGY

## 2021-08-20 MED ORDER — TRAMADOL HCL 50 MG PO TABS
ORAL_TABLET | ORAL | Status: AC
  Filled 2021-08-20: qty 1

## 2021-08-20 MED ORDER — TRAMADOL HCL 50 MG PO TABS: 50.0000 mg | ORAL_TABLET | Freq: Four times a day (QID) | ORAL | 0 refills | Status: AC | PRN

## 2021-08-20 MED ORDER — TRAMADOL HCL 50 MG PO TABS
50.0000 mg | ORAL_TABLET | Freq: Once | ORAL | Status: AC
  Administered 2021-08-20: 50 mg via ORAL

## 2021-08-20 MED ORDER — ACETAMINOPHEN 325 MG PO TABS
ORAL_TABLET | ORAL | Status: AC
  Filled 2021-08-20: qty 2

## 2021-08-20 MED ORDER — CEPHALEXIN 250 MG PO CAPS
250.0000 mg | ORAL_CAPSULE | Freq: Every day | ORAL | 0 refills | Status: AC
Start: 2021-08-20 — End: 2021-08-25

## 2021-08-20 NOTE — Discharge Summary (Signed)
Date of admission: 08/19/2021  Date of discharge: 08/20/2021  Admission diagnosis: BPH with bladder outlet obstruction  Discharge diagnosis: same  Secondary diagnoses:  Patient Active Problem List   Diagnosis Date Noted   BPH with obstruction/lower urinary tract symptoms 08/19/2021   Seizure-like activity ([place]) 06/05/2019   Chronic migraine 06/05/2019   Exertional dyspnea 10/05/2016    Procedures performed: Procedure(s): TRANSURETHRAL RESECTION OF THE PROSTATE (TURP)  History and Physical: For full details, please see admission history and physical. Briefly, Carl Flynn is a 56 y.o. year old patient with BPH with bladder outlet obstruction.   Hospital Course: Patient tolerated the procedure well.  He was then transferred to the floor after an uneventful PACU stay.  His hospital course was uncomplicated.  On POD#1 he had met discharge criteria: was eating a regular diet, was up and ambulating independently,  pain was well controlled, CBI was turned off and urine clear and was ready to for discharge.  PE NAD, alert and oriented Nonlabored respirations Foley draining clear yellow urine with CBI turned off  Laboratory values:  Recent Labs    08/19/21 1009  HGB 13.9  HCT 41.0   Recent Labs    08/19/21 1009  NA 141  K 3.6  CL 103  GLUCOSE 103*  BUN 12  CREATININE 0.90   No results for input(s): LABPT, INR in the last 72 hours. No results for input(s): LABURIN in the last 72 hours. No results found for this or any previous visit.  Disposition: Home  Discharge instruction: The patient was instructed to be ambulatory but told to refrain from heavy lifting, strenuous activity, or driving.   Discharge medications:  Allergies as of 08/20/2021       Reactions   Bee Venom    He has been through a series of injections that have lessened his reaction to a sting.   Hydrocodone    Rash   Lamictal [lamotrigine] Rash        Medication List     STOP taking these  medications    Fish Oil 1000 MG Caps   methylPREDNISolone 4 MG Tbpk tablet Commonly known as: MEDROL DOSEPAK   propranolol 10 MG tablet Commonly known as: INDERAL   UNABLE TO FIND   vitamin B-12 1000 MCG tablet Commonly known as: CYANOCOBALAMIN       TAKE these medications    ALBUTEROL IN Inhale 1 puff into the lungs as needed.   cephALEXin 250 MG capsule Commonly known as: KEFLEX Take 1 capsule (250 mg total) by mouth daily for 5 days.   cyclobenzaprine 10 MG tablet Commonly known as: FLEXERIL Take 10 mg by mouth as needed for muscle spasms.   lisinopril-hydrochlorothiazide 20-12.5 MG tablet Commonly known as: ZESTORETIC Take by mouth daily.   naproxen 500 MG tablet Commonly known as: NAPROSYN Take 500 mg by mouth as needed.   simvastatin 20 MG tablet Commonly known as: ZOCOR Take by mouth daily.   traMADol 50 MG tablet Commonly known as: Ultram Take 1 tablet (50 mg total) by mouth every 6 (six) hours as needed.   traZODone 100 MG tablet Commonly known as: DESYREL Take 100 mg by mouth daily.   venlafaxine XR 37.5 MG 24 hr capsule Commonly known as: EFFEXOR-XR Take 37.5 mg by mouth daily with breakfast.   venlafaxine XR 75 MG 24 hr capsule Commonly known as: EFFEXOR-XR Take 75 mg by mouth daily.   VITAMIN D3 PO Take 1.25 mcg by mouth daily.  Followup:   Follow-up Information     [facility] Follow up on 08/21/2021.   Why: 10:30am Contact information: [place] [phone]

## 2021-08-22 DIAGNOSIS — R3914 Feeling of incomplete bladder emptying: Secondary | ICD-10-CM | POA: Diagnosis not present

## 2021-08-26 DIAGNOSIS — R3914 Feeling of incomplete bladder emptying: Secondary | ICD-10-CM | POA: Diagnosis not present

## 2021-09-05 DIAGNOSIS — R350 Frequency of micturition: Secondary | ICD-10-CM | POA: Diagnosis not present

## 2021-09-05 DIAGNOSIS — R3914 Feeling of incomplete bladder emptying: Secondary | ICD-10-CM | POA: Diagnosis not present

## 2021-09-15 DIAGNOSIS — R3914 Feeling of incomplete bladder emptying: Secondary | ICD-10-CM | POA: Diagnosis not present

## 2021-09-15 DIAGNOSIS — N401 Enlarged prostate with lower urinary tract symptoms: Secondary | ICD-10-CM | POA: Diagnosis not present

## 2021-09-16 DIAGNOSIS — F603 Borderline personality disorder: Secondary | ICD-10-CM | POA: Diagnosis not present

## 2021-09-16 DIAGNOSIS — F251 Schizoaffective disorder, depressive type: Secondary | ICD-10-CM | POA: Diagnosis not present

## 2021-10-14 DIAGNOSIS — F603 Borderline personality disorder: Secondary | ICD-10-CM | POA: Diagnosis not present

## 2021-10-14 DIAGNOSIS — F251 Schizoaffective disorder, depressive type: Secondary | ICD-10-CM | POA: Diagnosis not present

## 2021-10-15 DIAGNOSIS — R3914 Feeling of incomplete bladder emptying: Secondary | ICD-10-CM | POA: Diagnosis not present

## 2021-11-04 DIAGNOSIS — F251 Schizoaffective disorder, depressive type: Secondary | ICD-10-CM | POA: Diagnosis not present

## 2021-11-04 DIAGNOSIS — F603 Borderline personality disorder: Secondary | ICD-10-CM | POA: Diagnosis not present

## 2021-11-13 DIAGNOSIS — I10 Essential (primary) hypertension: Secondary | ICD-10-CM | POA: Diagnosis not present

## 2021-11-13 DIAGNOSIS — E785 Hyperlipidemia, unspecified: Secondary | ICD-10-CM | POA: Diagnosis not present

## 2021-11-13 DIAGNOSIS — F332 Major depressive disorder, recurrent severe without psychotic features: Secondary | ICD-10-CM | POA: Diagnosis not present

## 2021-11-13 DIAGNOSIS — J4531 Mild persistent asthma with (acute) exacerbation: Secondary | ICD-10-CM | POA: Diagnosis not present

## 2021-11-18 DIAGNOSIS — F603 Borderline personality disorder: Secondary | ICD-10-CM | POA: Diagnosis not present

## 2021-11-18 DIAGNOSIS — F251 Schizoaffective disorder, depressive type: Secondary | ICD-10-CM | POA: Diagnosis not present

## 2021-12-16 DIAGNOSIS — F603 Borderline personality disorder: Secondary | ICD-10-CM | POA: Diagnosis not present

## 2021-12-16 DIAGNOSIS — F251 Schizoaffective disorder, depressive type: Secondary | ICD-10-CM | POA: Diagnosis not present

## 2022-01-29 DIAGNOSIS — R3914 Feeling of incomplete bladder emptying: Secondary | ICD-10-CM | POA: Diagnosis not present

## 2022-01-29 DIAGNOSIS — N401 Enlarged prostate with lower urinary tract symptoms: Secondary | ICD-10-CM | POA: Diagnosis not present

## 2022-01-29 DIAGNOSIS — R350 Frequency of micturition: Secondary | ICD-10-CM | POA: Diagnosis not present

## 2022-01-29 DIAGNOSIS — R3912 Poor urinary stream: Secondary | ICD-10-CM | POA: Diagnosis not present

## 2022-02-18 DIAGNOSIS — M62838 Other muscle spasm: Secondary | ICD-10-CM | POA: Diagnosis not present

## 2022-02-18 DIAGNOSIS — N3943 Post-void dribbling: Secondary | ICD-10-CM | POA: Diagnosis not present

## 2022-02-18 DIAGNOSIS — M6281 Muscle weakness (generalized): Secondary | ICD-10-CM | POA: Diagnosis not present

## 2022-02-18 DIAGNOSIS — M6289 Other specified disorders of muscle: Secondary | ICD-10-CM | POA: Diagnosis not present

## 2022-02-18 DIAGNOSIS — N3941 Urge incontinence: Secondary | ICD-10-CM | POA: Diagnosis not present

## 2022-02-18 DIAGNOSIS — K5900 Constipation, unspecified: Secondary | ICD-10-CM | POA: Diagnosis not present

## 2022-03-09 DIAGNOSIS — E785 Hyperlipidemia, unspecified: Secondary | ICD-10-CM | POA: Diagnosis not present

## 2022-03-09 DIAGNOSIS — Z136 Encounter for screening for cardiovascular disorders: Secondary | ICD-10-CM | POA: Diagnosis not present

## 2022-03-09 DIAGNOSIS — J4531 Mild persistent asthma with (acute) exacerbation: Secondary | ICD-10-CM | POA: Diagnosis not present

## 2022-03-09 DIAGNOSIS — F332 Major depressive disorder, recurrent severe without psychotic features: Secondary | ICD-10-CM | POA: Diagnosis not present

## 2022-03-09 DIAGNOSIS — I10 Essential (primary) hypertension: Secondary | ICD-10-CM | POA: Diagnosis not present

## 2022-03-09 DIAGNOSIS — K219 Gastro-esophageal reflux disease without esophagitis: Secondary | ICD-10-CM | POA: Diagnosis not present

## 2022-03-09 DIAGNOSIS — Z23 Encounter for immunization: Secondary | ICD-10-CM | POA: Diagnosis not present

## 2022-03-09 DIAGNOSIS — Z7689 Persons encountering health services in other specified circumstances: Secondary | ICD-10-CM | POA: Diagnosis not present

## 2022-03-09 DIAGNOSIS — Z125 Encounter for screening for malignant neoplasm of prostate: Secondary | ICD-10-CM | POA: Diagnosis not present

## 2022-03-09 DIAGNOSIS — Z0000 Encounter for general adult medical examination without abnormal findings: Secondary | ICD-10-CM | POA: Diagnosis not present

## 2022-03-10 DIAGNOSIS — F331 Major depressive disorder, recurrent, moderate: Secondary | ICD-10-CM | POA: Diagnosis not present

## 2022-03-10 DIAGNOSIS — F4312 Post-traumatic stress disorder, chronic: Secondary | ICD-10-CM | POA: Diagnosis not present

## 2022-03-10 DIAGNOSIS — F251 Schizoaffective disorder, depressive type: Secondary | ICD-10-CM | POA: Diagnosis not present

## 2022-03-10 DIAGNOSIS — F603 Borderline personality disorder: Secondary | ICD-10-CM | POA: Diagnosis not present

## 2022-03-17 DIAGNOSIS — K5900 Constipation, unspecified: Secondary | ICD-10-CM | POA: Diagnosis not present

## 2022-03-17 DIAGNOSIS — R3914 Feeling of incomplete bladder emptying: Secondary | ICD-10-CM | POA: Diagnosis not present

## 2022-03-17 DIAGNOSIS — M6281 Muscle weakness (generalized): Secondary | ICD-10-CM | POA: Diagnosis not present

## 2022-03-17 DIAGNOSIS — M62838 Other muscle spasm: Secondary | ICD-10-CM | POA: Diagnosis not present

## 2022-03-17 DIAGNOSIS — N3941 Urge incontinence: Secondary | ICD-10-CM | POA: Diagnosis not present

## 2022-03-17 DIAGNOSIS — M6289 Other specified disorders of muscle: Secondary | ICD-10-CM | POA: Diagnosis not present

## 2022-03-18 DIAGNOSIS — L989 Disorder of the skin and subcutaneous tissue, unspecified: Secondary | ICD-10-CM | POA: Diagnosis not present

## 2022-03-18 DIAGNOSIS — B078 Other viral warts: Secondary | ICD-10-CM | POA: Diagnosis not present

## 2022-03-19 DIAGNOSIS — F900 Attention-deficit hyperactivity disorder, predominantly inattentive type: Secondary | ICD-10-CM | POA: Diagnosis not present

## 2022-03-20 DIAGNOSIS — I10 Essential (primary) hypertension: Secondary | ICD-10-CM | POA: Diagnosis not present

## 2022-03-20 DIAGNOSIS — G4719 Other hypersomnia: Secondary | ICD-10-CM | POA: Diagnosis not present

## 2022-03-20 DIAGNOSIS — F5104 Psychophysiologic insomnia: Secondary | ICD-10-CM | POA: Diagnosis not present

## 2022-03-20 DIAGNOSIS — F332 Major depressive disorder, recurrent severe without psychotic features: Secondary | ICD-10-CM | POA: Diagnosis not present

## 2022-03-20 DIAGNOSIS — E669 Obesity, unspecified: Secondary | ICD-10-CM | POA: Diagnosis not present

## 2022-03-24 DIAGNOSIS — F251 Schizoaffective disorder, depressive type: Secondary | ICD-10-CM | POA: Diagnosis not present

## 2022-03-24 DIAGNOSIS — F603 Borderline personality disorder: Secondary | ICD-10-CM | POA: Diagnosis not present

## 2022-03-24 DIAGNOSIS — F4312 Post-traumatic stress disorder, chronic: Secondary | ICD-10-CM | POA: Diagnosis not present

## 2022-03-24 DIAGNOSIS — F331 Major depressive disorder, recurrent, moderate: Secondary | ICD-10-CM | POA: Diagnosis not present

## 2022-03-30 DIAGNOSIS — R051 Acute cough: Secondary | ICD-10-CM | POA: Diagnosis not present

## 2022-03-30 DIAGNOSIS — J309 Allergic rhinitis, unspecified: Secondary | ICD-10-CM | POA: Diagnosis not present

## 2022-04-06 DIAGNOSIS — J4531 Mild persistent asthma with (acute) exacerbation: Secondary | ICD-10-CM | POA: Diagnosis not present

## 2022-04-07 DIAGNOSIS — F331 Major depressive disorder, recurrent, moderate: Secondary | ICD-10-CM | POA: Diagnosis not present

## 2022-04-07 DIAGNOSIS — F4312 Post-traumatic stress disorder, chronic: Secondary | ICD-10-CM | POA: Diagnosis not present

## 2022-04-20 DIAGNOSIS — E669 Obesity, unspecified: Secondary | ICD-10-CM | POA: Diagnosis not present

## 2022-04-20 DIAGNOSIS — I10 Essential (primary) hypertension: Secondary | ICD-10-CM | POA: Diagnosis not present

## 2022-04-20 DIAGNOSIS — G4733 Obstructive sleep apnea (adult) (pediatric): Secondary | ICD-10-CM | POA: Diagnosis not present

## 2022-04-21 DIAGNOSIS — R3914 Feeling of incomplete bladder emptying: Secondary | ICD-10-CM | POA: Diagnosis not present

## 2022-04-21 DIAGNOSIS — N3941 Urge incontinence: Secondary | ICD-10-CM | POA: Diagnosis not present

## 2022-04-21 DIAGNOSIS — M6281 Muscle weakness (generalized): Secondary | ICD-10-CM | POA: Diagnosis not present

## 2022-04-21 DIAGNOSIS — F4312 Post-traumatic stress disorder, chronic: Secondary | ICD-10-CM | POA: Diagnosis not present

## 2022-04-21 DIAGNOSIS — M6289 Other specified disorders of muscle: Secondary | ICD-10-CM | POA: Diagnosis not present

## 2022-04-21 DIAGNOSIS — F331 Major depressive disorder, recurrent, moderate: Secondary | ICD-10-CM | POA: Diagnosis not present

## 2022-04-21 DIAGNOSIS — M62838 Other muscle spasm: Secondary | ICD-10-CM | POA: Diagnosis not present

## 2022-04-21 DIAGNOSIS — K5900 Constipation, unspecified: Secondary | ICD-10-CM | POA: Diagnosis not present

## 2022-04-29 DIAGNOSIS — M62838 Other muscle spasm: Secondary | ICD-10-CM | POA: Diagnosis not present

## 2022-04-29 DIAGNOSIS — N3941 Urge incontinence: Secondary | ICD-10-CM | POA: Diagnosis not present

## 2022-04-29 DIAGNOSIS — M6281 Muscle weakness (generalized): Secondary | ICD-10-CM | POA: Diagnosis not present

## 2022-04-29 DIAGNOSIS — M6289 Other specified disorders of muscle: Secondary | ICD-10-CM | POA: Diagnosis not present

## 2022-04-29 DIAGNOSIS — K5900 Constipation, unspecified: Secondary | ICD-10-CM | POA: Diagnosis not present

## 2022-05-04 DIAGNOSIS — M6289 Other specified disorders of muscle: Secondary | ICD-10-CM | POA: Diagnosis not present

## 2022-05-04 DIAGNOSIS — M6281 Muscle weakness (generalized): Secondary | ICD-10-CM | POA: Diagnosis not present

## 2022-05-04 DIAGNOSIS — N3941 Urge incontinence: Secondary | ICD-10-CM | POA: Diagnosis not present

## 2022-05-04 DIAGNOSIS — K5900 Constipation, unspecified: Secondary | ICD-10-CM | POA: Diagnosis not present

## 2022-05-04 DIAGNOSIS — M62838 Other muscle spasm: Secondary | ICD-10-CM | POA: Diagnosis not present

## 2022-05-11 DIAGNOSIS — M9905 Segmental and somatic dysfunction of pelvic region: Secondary | ICD-10-CM | POA: Diagnosis not present

## 2022-05-11 DIAGNOSIS — M5432 Sciatica, left side: Secondary | ICD-10-CM | POA: Diagnosis not present

## 2022-05-11 DIAGNOSIS — M9903 Segmental and somatic dysfunction of lumbar region: Secondary | ICD-10-CM | POA: Diagnosis not present

## 2022-05-11 DIAGNOSIS — M6289 Other specified disorders of muscle: Secondary | ICD-10-CM | POA: Diagnosis not present

## 2022-05-11 DIAGNOSIS — N3941 Urge incontinence: Secondary | ICD-10-CM | POA: Diagnosis not present

## 2022-05-11 DIAGNOSIS — M9901 Segmental and somatic dysfunction of cervical region: Secondary | ICD-10-CM | POA: Diagnosis not present

## 2022-05-11 DIAGNOSIS — M6281 Muscle weakness (generalized): Secondary | ICD-10-CM | POA: Diagnosis not present

## 2022-05-11 DIAGNOSIS — M62838 Other muscle spasm: Secondary | ICD-10-CM | POA: Diagnosis not present

## 2022-05-11 DIAGNOSIS — M531 Cervicobrachial syndrome: Secondary | ICD-10-CM | POA: Diagnosis not present

## 2022-05-11 DIAGNOSIS — K5900 Constipation, unspecified: Secondary | ICD-10-CM | POA: Diagnosis not present

## 2022-05-13 DIAGNOSIS — M9901 Segmental and somatic dysfunction of cervical region: Secondary | ICD-10-CM | POA: Diagnosis not present

## 2022-05-13 DIAGNOSIS — M9905 Segmental and somatic dysfunction of pelvic region: Secondary | ICD-10-CM | POA: Diagnosis not present

## 2022-05-13 DIAGNOSIS — M531 Cervicobrachial syndrome: Secondary | ICD-10-CM | POA: Diagnosis not present

## 2022-05-13 DIAGNOSIS — M9903 Segmental and somatic dysfunction of lumbar region: Secondary | ICD-10-CM | POA: Diagnosis not present

## 2022-05-13 DIAGNOSIS — M5432 Sciatica, left side: Secondary | ICD-10-CM | POA: Diagnosis not present

## 2022-05-18 DIAGNOSIS — F331 Major depressive disorder, recurrent, moderate: Secondary | ICD-10-CM | POA: Diagnosis not present

## 2022-05-18 DIAGNOSIS — M531 Cervicobrachial syndrome: Secondary | ICD-10-CM | POA: Diagnosis not present

## 2022-05-18 DIAGNOSIS — M9903 Segmental and somatic dysfunction of lumbar region: Secondary | ICD-10-CM | POA: Diagnosis not present

## 2022-05-18 DIAGNOSIS — F4312 Post-traumatic stress disorder, chronic: Secondary | ICD-10-CM | POA: Diagnosis not present

## 2022-05-18 DIAGNOSIS — M9901 Segmental and somatic dysfunction of cervical region: Secondary | ICD-10-CM | POA: Diagnosis not present

## 2022-05-18 DIAGNOSIS — M5432 Sciatica, left side: Secondary | ICD-10-CM | POA: Diagnosis not present

## 2022-05-18 DIAGNOSIS — M9905 Segmental and somatic dysfunction of pelvic region: Secondary | ICD-10-CM | POA: Diagnosis not present

## 2022-05-20 DIAGNOSIS — R3915 Urgency of urination: Secondary | ICD-10-CM | POA: Diagnosis not present

## 2022-05-20 DIAGNOSIS — N3943 Post-void dribbling: Secondary | ICD-10-CM | POA: Diagnosis not present

## 2022-05-20 DIAGNOSIS — N401 Enlarged prostate with lower urinary tract symptoms: Secondary | ICD-10-CM | POA: Diagnosis not present

## 2022-05-28 DIAGNOSIS — M531 Cervicobrachial syndrome: Secondary | ICD-10-CM | POA: Diagnosis not present

## 2022-05-28 DIAGNOSIS — M9901 Segmental and somatic dysfunction of cervical region: Secondary | ICD-10-CM | POA: Diagnosis not present

## 2022-05-28 DIAGNOSIS — M9905 Segmental and somatic dysfunction of pelvic region: Secondary | ICD-10-CM | POA: Diagnosis not present

## 2022-05-28 DIAGNOSIS — M9903 Segmental and somatic dysfunction of lumbar region: Secondary | ICD-10-CM | POA: Diagnosis not present

## 2022-05-28 DIAGNOSIS — M5432 Sciatica, left side: Secondary | ICD-10-CM | POA: Diagnosis not present

## 2022-05-29 DIAGNOSIS — R002 Palpitations: Secondary | ICD-10-CM | POA: Diagnosis not present

## 2022-06-02 DIAGNOSIS — J4531 Mild persistent asthma with (acute) exacerbation: Secondary | ICD-10-CM | POA: Diagnosis not present

## 2022-06-03 DIAGNOSIS — M6289 Other specified disorders of muscle: Secondary | ICD-10-CM | POA: Diagnosis not present

## 2022-06-03 DIAGNOSIS — M62838 Other muscle spasm: Secondary | ICD-10-CM | POA: Diagnosis not present

## 2022-06-03 DIAGNOSIS — N3941 Urge incontinence: Secondary | ICD-10-CM | POA: Diagnosis not present

## 2022-06-03 DIAGNOSIS — K5900 Constipation, unspecified: Secondary | ICD-10-CM | POA: Diagnosis not present

## 2022-06-03 DIAGNOSIS — M6281 Muscle weakness (generalized): Secondary | ICD-10-CM | POA: Diagnosis not present

## 2022-06-04 DIAGNOSIS — M531 Cervicobrachial syndrome: Secondary | ICD-10-CM | POA: Diagnosis not present

## 2022-06-04 DIAGNOSIS — M9901 Segmental and somatic dysfunction of cervical region: Secondary | ICD-10-CM | POA: Diagnosis not present

## 2022-06-04 DIAGNOSIS — M5432 Sciatica, left side: Secondary | ICD-10-CM | POA: Diagnosis not present

## 2022-06-04 DIAGNOSIS — M9905 Segmental and somatic dysfunction of pelvic region: Secondary | ICD-10-CM | POA: Diagnosis not present

## 2022-06-04 DIAGNOSIS — M9903 Segmental and somatic dysfunction of lumbar region: Secondary | ICD-10-CM | POA: Diagnosis not present

## 2022-06-10 DIAGNOSIS — N3941 Urge incontinence: Secondary | ICD-10-CM | POA: Diagnosis not present

## 2022-06-10 DIAGNOSIS — M6289 Other specified disorders of muscle: Secondary | ICD-10-CM | POA: Diagnosis not present

## 2022-06-10 DIAGNOSIS — M62838 Other muscle spasm: Secondary | ICD-10-CM | POA: Diagnosis not present

## 2022-06-10 DIAGNOSIS — K5900 Constipation, unspecified: Secondary | ICD-10-CM | POA: Diagnosis not present

## 2022-06-10 DIAGNOSIS — M6281 Muscle weakness (generalized): Secondary | ICD-10-CM | POA: Diagnosis not present

## 2022-06-11 DIAGNOSIS — M5432 Sciatica, left side: Secondary | ICD-10-CM | POA: Diagnosis not present

## 2022-06-11 DIAGNOSIS — M9903 Segmental and somatic dysfunction of lumbar region: Secondary | ICD-10-CM | POA: Diagnosis not present

## 2022-06-11 DIAGNOSIS — M9901 Segmental and somatic dysfunction of cervical region: Secondary | ICD-10-CM | POA: Diagnosis not present

## 2022-06-11 DIAGNOSIS — M531 Cervicobrachial syndrome: Secondary | ICD-10-CM | POA: Diagnosis not present

## 2022-06-11 DIAGNOSIS — M9905 Segmental and somatic dysfunction of pelvic region: Secondary | ICD-10-CM | POA: Diagnosis not present

## 2022-06-16 DIAGNOSIS — F4312 Post-traumatic stress disorder, chronic: Secondary | ICD-10-CM | POA: Diagnosis not present

## 2022-06-16 DIAGNOSIS — F331 Major depressive disorder, recurrent, moderate: Secondary | ICD-10-CM | POA: Diagnosis not present

## 2022-06-17 DIAGNOSIS — M6289 Other specified disorders of muscle: Secondary | ICD-10-CM | POA: Diagnosis not present

## 2022-06-17 DIAGNOSIS — N3941 Urge incontinence: Secondary | ICD-10-CM | POA: Diagnosis not present

## 2022-06-17 DIAGNOSIS — M62838 Other muscle spasm: Secondary | ICD-10-CM | POA: Diagnosis not present

## 2022-06-17 DIAGNOSIS — M6281 Muscle weakness (generalized): Secondary | ICD-10-CM | POA: Diagnosis not present

## 2022-06-17 DIAGNOSIS — K5900 Constipation, unspecified: Secondary | ICD-10-CM | POA: Diagnosis not present

## 2022-06-24 DIAGNOSIS — M6289 Other specified disorders of muscle: Secondary | ICD-10-CM | POA: Diagnosis not present

## 2022-06-24 DIAGNOSIS — N3941 Urge incontinence: Secondary | ICD-10-CM | POA: Diagnosis not present

## 2022-06-24 DIAGNOSIS — M62838 Other muscle spasm: Secondary | ICD-10-CM | POA: Diagnosis not present

## 2022-06-24 DIAGNOSIS — M6281 Muscle weakness (generalized): Secondary | ICD-10-CM | POA: Diagnosis not present

## 2022-07-02 DIAGNOSIS — M531 Cervicobrachial syndrome: Secondary | ICD-10-CM | POA: Diagnosis not present

## 2022-07-02 DIAGNOSIS — M9901 Segmental and somatic dysfunction of cervical region: Secondary | ICD-10-CM | POA: Diagnosis not present

## 2022-07-02 DIAGNOSIS — M5432 Sciatica, left side: Secondary | ICD-10-CM | POA: Diagnosis not present

## 2022-07-02 DIAGNOSIS — M9905 Segmental and somatic dysfunction of pelvic region: Secondary | ICD-10-CM | POA: Diagnosis not present

## 2022-07-02 DIAGNOSIS — M9903 Segmental and somatic dysfunction of lumbar region: Secondary | ICD-10-CM | POA: Diagnosis not present

## 2022-07-15 DIAGNOSIS — R69 Illness, unspecified: Secondary | ICD-10-CM | POA: Diagnosis not present

## 2022-07-15 DIAGNOSIS — F4312 Post-traumatic stress disorder, chronic: Secondary | ICD-10-CM | POA: Diagnosis not present

## 2022-07-15 DIAGNOSIS — F331 Major depressive disorder, recurrent, moderate: Secondary | ICD-10-CM | POA: Diagnosis not present

## 2022-07-16 DIAGNOSIS — M5432 Sciatica, left side: Secondary | ICD-10-CM | POA: Diagnosis not present

## 2022-07-16 DIAGNOSIS — M531 Cervicobrachial syndrome: Secondary | ICD-10-CM | POA: Diagnosis not present

## 2022-07-16 DIAGNOSIS — M9901 Segmental and somatic dysfunction of cervical region: Secondary | ICD-10-CM | POA: Diagnosis not present

## 2022-07-16 DIAGNOSIS — M9905 Segmental and somatic dysfunction of pelvic region: Secondary | ICD-10-CM | POA: Diagnosis not present

## 2022-07-16 DIAGNOSIS — M9903 Segmental and somatic dysfunction of lumbar region: Secondary | ICD-10-CM | POA: Diagnosis not present

## 2022-07-22 DIAGNOSIS — R102 Pelvic and perineal pain: Secondary | ICD-10-CM | POA: Diagnosis not present

## 2022-07-22 DIAGNOSIS — M6289 Other specified disorders of muscle: Secondary | ICD-10-CM | POA: Diagnosis not present

## 2022-07-22 DIAGNOSIS — N3941 Urge incontinence: Secondary | ICD-10-CM | POA: Diagnosis not present

## 2022-07-22 DIAGNOSIS — M6281 Muscle weakness (generalized): Secondary | ICD-10-CM | POA: Diagnosis not present

## 2022-07-22 DIAGNOSIS — M62838 Other muscle spasm: Secondary | ICD-10-CM | POA: Diagnosis not present

## 2022-07-29 DIAGNOSIS — M6281 Muscle weakness (generalized): Secondary | ICD-10-CM | POA: Diagnosis not present

## 2022-07-29 DIAGNOSIS — M62838 Other muscle spasm: Secondary | ICD-10-CM | POA: Diagnosis not present

## 2022-07-29 DIAGNOSIS — R102 Pelvic and perineal pain: Secondary | ICD-10-CM | POA: Diagnosis not present

## 2022-07-29 DIAGNOSIS — M6289 Other specified disorders of muscle: Secondary | ICD-10-CM | POA: Diagnosis not present

## 2022-07-30 DIAGNOSIS — M9901 Segmental and somatic dysfunction of cervical region: Secondary | ICD-10-CM | POA: Diagnosis not present

## 2022-07-30 DIAGNOSIS — M5432 Sciatica, left side: Secondary | ICD-10-CM | POA: Diagnosis not present

## 2022-07-30 DIAGNOSIS — M9903 Segmental and somatic dysfunction of lumbar region: Secondary | ICD-10-CM | POA: Diagnosis not present

## 2022-07-30 DIAGNOSIS — M9905 Segmental and somatic dysfunction of pelvic region: Secondary | ICD-10-CM | POA: Diagnosis not present

## 2022-07-30 DIAGNOSIS — M531 Cervicobrachial syndrome: Secondary | ICD-10-CM | POA: Diagnosis not present

## 2022-08-13 DIAGNOSIS — F331 Major depressive disorder, recurrent, moderate: Secondary | ICD-10-CM | POA: Diagnosis not present

## 2022-08-13 DIAGNOSIS — R69 Illness, unspecified: Secondary | ICD-10-CM | POA: Diagnosis not present

## 2022-08-13 DIAGNOSIS — F4312 Post-traumatic stress disorder, chronic: Secondary | ICD-10-CM | POA: Diagnosis not present

## 2022-08-19 DIAGNOSIS — M62838 Other muscle spasm: Secondary | ICD-10-CM | POA: Diagnosis not present

## 2022-08-19 DIAGNOSIS — M6289 Other specified disorders of muscle: Secondary | ICD-10-CM | POA: Diagnosis not present

## 2022-08-19 DIAGNOSIS — N3941 Urge incontinence: Secondary | ICD-10-CM | POA: Diagnosis not present

## 2022-08-19 DIAGNOSIS — R102 Pelvic and perineal pain: Secondary | ICD-10-CM | POA: Diagnosis not present

## 2022-08-24 DIAGNOSIS — H60503 Unspecified acute noninfective otitis externa, bilateral: Secondary | ICD-10-CM | POA: Diagnosis not present

## 2022-08-25 DIAGNOSIS — E669 Obesity, unspecified: Secondary | ICD-10-CM | POA: Diagnosis not present

## 2022-08-25 DIAGNOSIS — I10 Essential (primary) hypertension: Secondary | ICD-10-CM | POA: Diagnosis not present

## 2022-08-25 DIAGNOSIS — G4733 Obstructive sleep apnea (adult) (pediatric): Secondary | ICD-10-CM | POA: Diagnosis not present

## 2022-09-02 DIAGNOSIS — N3941 Urge incontinence: Secondary | ICD-10-CM | POA: Diagnosis not present

## 2022-09-02 DIAGNOSIS — K5900 Constipation, unspecified: Secondary | ICD-10-CM | POA: Diagnosis not present

## 2022-09-02 DIAGNOSIS — R102 Pelvic and perineal pain: Secondary | ICD-10-CM | POA: Diagnosis not present

## 2022-09-02 DIAGNOSIS — M62838 Other muscle spasm: Secondary | ICD-10-CM | POA: Diagnosis not present

## 2022-09-02 DIAGNOSIS — M6281 Muscle weakness (generalized): Secondary | ICD-10-CM | POA: Diagnosis not present

## 2022-09-02 DIAGNOSIS — M6289 Other specified disorders of muscle: Secondary | ICD-10-CM | POA: Diagnosis not present

## 2022-09-07 DIAGNOSIS — K219 Gastro-esophageal reflux disease without esophagitis: Secondary | ICD-10-CM | POA: Diagnosis not present

## 2022-09-07 DIAGNOSIS — R69 Illness, unspecified: Secondary | ICD-10-CM | POA: Diagnosis not present

## 2022-09-07 DIAGNOSIS — E785 Hyperlipidemia, unspecified: Secondary | ICD-10-CM | POA: Diagnosis not present

## 2022-09-07 DIAGNOSIS — J4531 Mild persistent asthma with (acute) exacerbation: Secondary | ICD-10-CM | POA: Diagnosis not present

## 2022-09-07 DIAGNOSIS — F332 Major depressive disorder, recurrent severe without psychotic features: Secondary | ICD-10-CM | POA: Diagnosis not present

## 2022-09-07 DIAGNOSIS — F259 Schizoaffective disorder, unspecified: Secondary | ICD-10-CM | POA: Diagnosis not present

## 2022-09-10 DIAGNOSIS — R69 Illness, unspecified: Secondary | ICD-10-CM | POA: Diagnosis not present

## 2022-09-10 DIAGNOSIS — F4312 Post-traumatic stress disorder, chronic: Secondary | ICD-10-CM | POA: Diagnosis not present

## 2022-09-10 DIAGNOSIS — F331 Major depressive disorder, recurrent, moderate: Secondary | ICD-10-CM | POA: Diagnosis not present

## 2022-09-18 DIAGNOSIS — R3914 Feeling of incomplete bladder emptying: Secondary | ICD-10-CM | POA: Diagnosis not present

## 2022-09-30 DIAGNOSIS — G4733 Obstructive sleep apnea (adult) (pediatric): Secondary | ICD-10-CM | POA: Diagnosis not present

## 2022-10-08 DIAGNOSIS — F331 Major depressive disorder, recurrent, moderate: Secondary | ICD-10-CM | POA: Diagnosis not present

## 2022-10-08 DIAGNOSIS — F4312 Post-traumatic stress disorder, chronic: Secondary | ICD-10-CM | POA: Diagnosis not present

## 2022-10-08 DIAGNOSIS — R69 Illness, unspecified: Secondary | ICD-10-CM | POA: Diagnosis not present

## 2022-10-15 DIAGNOSIS — K5900 Constipation, unspecified: Secondary | ICD-10-CM | POA: Diagnosis not present

## 2022-10-15 DIAGNOSIS — M62838 Other muscle spasm: Secondary | ICD-10-CM | POA: Diagnosis not present

## 2022-10-15 DIAGNOSIS — M6289 Other specified disorders of muscle: Secondary | ICD-10-CM | POA: Diagnosis not present

## 2022-10-15 DIAGNOSIS — M6281 Muscle weakness (generalized): Secondary | ICD-10-CM | POA: Diagnosis not present

## 2022-10-15 DIAGNOSIS — N3941 Urge incontinence: Secondary | ICD-10-CM | POA: Diagnosis not present

## 2022-10-15 DIAGNOSIS — R102 Pelvic and perineal pain: Secondary | ICD-10-CM | POA: Diagnosis not present

## 2022-10-23 DIAGNOSIS — G4733 Obstructive sleep apnea (adult) (pediatric): Secondary | ICD-10-CM | POA: Diagnosis not present

## 2022-10-30 DIAGNOSIS — G4733 Obstructive sleep apnea (adult) (pediatric): Secondary | ICD-10-CM | POA: Diagnosis not present

## 2022-11-05 DIAGNOSIS — F331 Major depressive disorder, recurrent, moderate: Secondary | ICD-10-CM | POA: Diagnosis not present

## 2022-11-05 DIAGNOSIS — F4312 Post-traumatic stress disorder, chronic: Secondary | ICD-10-CM | POA: Diagnosis not present

## 2022-11-22 DIAGNOSIS — G4733 Obstructive sleep apnea (adult) (pediatric): Secondary | ICD-10-CM | POA: Diagnosis not present

## 2022-11-25 DIAGNOSIS — L309 Dermatitis, unspecified: Secondary | ICD-10-CM | POA: Diagnosis not present

## 2022-11-30 DIAGNOSIS — G4733 Obstructive sleep apnea (adult) (pediatric): Secondary | ICD-10-CM | POA: Diagnosis not present

## 2022-12-13 DIAGNOSIS — S30861A Insect bite (nonvenomous) of abdominal wall, initial encounter: Secondary | ICD-10-CM | POA: Diagnosis not present

## 2022-12-22 ENCOUNTER — Encounter: Payer: Self-pay | Admitting: Allergy

## 2022-12-22 ENCOUNTER — Ambulatory Visit: Payer: Medicare HMO | Admitting: Allergy

## 2022-12-22 VITALS — BP 118/74 | HR 76 | Temp 97.9°F | Resp 16 | Ht 67.0 in | Wt 242.2 lb

## 2022-12-22 DIAGNOSIS — Z91038 Other insect allergy status: Secondary | ICD-10-CM | POA: Diagnosis not present

## 2022-12-22 DIAGNOSIS — K219 Gastro-esophageal reflux disease without esophagitis: Secondary | ICD-10-CM | POA: Diagnosis not present

## 2022-12-22 DIAGNOSIS — J310 Chronic rhinitis: Secondary | ICD-10-CM | POA: Diagnosis not present

## 2022-12-22 DIAGNOSIS — T781XXD Other adverse food reactions, not elsewhere classified, subsequent encounter: Secondary | ICD-10-CM

## 2022-12-22 DIAGNOSIS — T781XXA Other adverse food reactions, not elsewhere classified, initial encounter: Secondary | ICD-10-CM | POA: Diagnosis not present

## 2022-12-22 DIAGNOSIS — L299 Pruritus, unspecified: Secondary | ICD-10-CM | POA: Diagnosis not present

## 2022-12-22 DIAGNOSIS — J4540 Moderate persistent asthma, uncomplicated: Secondary | ICD-10-CM | POA: Insufficient documentation

## 2022-12-22 DIAGNOSIS — R21 Rash and other nonspecific skin eruption: Secondary | ICD-10-CM | POA: Insufficient documentation

## 2022-12-22 NOTE — Assessment & Plan Note (Signed)
Perennial rhinitis symptoms which flare in the spring and fall. Takes Singulair and allegra with some benefit.  Today's skin prick testing showed: Negative to indoor/outdoor allergens. Get bloodwork to double check.   Continue Singulair (montelukast) 10mg  daily at night. Use Flonase (fluticasone) nasal spray 1 spray per nostril twice a day as needed for nasal congestion.  Nasal saline spray (i.e., Simply Saline) or nasal saline lavage (i.e., NeilMed) is recommended as needed and prior to medicated nasal sprays.

## 2022-12-22 NOTE — Assessment & Plan Note (Signed)
Asthma as a child which was quiescent until Covid-19 infection in 2020. Recently prescribed Wixela but didn't start yet. Triggers include strong scents.  Today's skin prick testing showed: Negative to indoor/outdoor allergens. Today's spirometry showed: normal pattern with 4% improvement in FEV1 post bronchodilator treatment. Clinically feeling slightly improved.  Daily controller medication(s): start Wixela 1 puff twice a day and rinse mouth after each use as prescribed. Demonstrated proper use.  May use albuterol rescue inhaler 2 puffs every 4 to 6 hours as needed for shortness of breath, chest tightness, coughing, and wheezing. May use albuterol rescue inhaler 2 puffs 5 to 15 minutes prior to strenuous physical activities. Monitor frequency of use - if you need to use it more than twice per week on a consistent basis let us know.  Get spirometry at next visit.

## 2022-12-22 NOTE — Assessment & Plan Note (Signed)
See handout for lifestyle and dietary modifications. Continue omeprazole 40mg  once day - nothing to eat or drink for 20-30 minutes afterwards.

## 2022-12-22 NOTE — Assessment & Plan Note (Signed)
Patient's heart apparently stopped twice after a sting. Was on VIT with good benefit. Last sting only caused localized reactions. No recent work up. Continue to avoid.  Get bloodwork - if positive will send in Epipen.  For mild symptoms you can take over the counter antihistamines such as Benadryl and monitor symptoms closely. If symptoms worsen or if you have severe symptoms including breathing issues, throat closure, significant swelling, whole body hives, severe diarrhea and vomiting, lightheadedness then seek immediate medical care.

## 2022-12-22 NOTE — Assessment & Plan Note (Signed)
Garlic, onions, black pepper and avocado all cause GI issues. Tuna flares GERD symptoms. Today's skin testing showed: Borderline to soy. Negative to other select foods.  Continue to avoid foods that are bothersome.

## 2022-12-22 NOTE — Progress Notes (Signed)
New Patient Note  RE: Carl Flynn MRN: [id number] DOB: 1966-05-29 Date of Office Visit: 12/22/2022  Consult requested by: Sheliah Hatch, PA-C Primary care provider: Sheliah Hatch, PA-C  Chief Complaint: Allergic Rhinitis , Asthma, and Other (Hx of bee allergy- used to do venom injections in the past. Has been stung since then and has only had swelling /Bites get big and swollen)  History of Present Illness: I had the pleasure of seeing Carl Flynn for initial evaluation at the [facility] on 12/22/2022. He is a 57 y.o. male, who is referred here by Sheliah Hatch, PA-C for the evaluation of allergic rhinitis and asthma.  Asthma He reports symptoms of shortness of breath, coughing, wheezing as a child and then it flared after he got Covid-19 in 2020. Current medications include albuterol prn which help. He reports not using aerochamber with inhalers.  Recently prescribed Wixela but hasn't started it yet.  Main triggers are unknown but scents is one of them.  In the last month, frequency of symptoms: <1x/week. Frequency of SABA use: <1x/week. Interference with physical activity: sometimes. In the last 12 months, emergency room visits/urgent care visits/doctor office visits or hospitalizations due to respiratory issues: no. In the last 12 months, oral steroids courses: maybe. Lifetime history of hospitalization for respiratory issues: no. Prior intubations: no. History of pneumonia: once. He was evaluated by allergist in the past. Smoking exposure: quit. Up to date with flu vaccine: not sure. Up to date with COVID-19 vaccine: yes. Prior Covid-19 infection: 2 times. History of reflux: sometimes and takes omeprazole.  Environmental allergies He reports symptoms of nasal congestion, sneezing. Symptoms have been going on for many years. The symptoms are present all year around with worsening in the spring and fall. Anosmia: no. Headache: sometimes.  He has used Singulair, Careers adviser with some improvement in symptoms. Sinus infections: no. Previous work up includes: not sure. No prior AIT for allergic rhinitis. Previous ENT evaluation: ENT told him he has GERD.  Previous sinus imaging: no. History of nasal polyps: no.  Assessment and Plan: Carl Flynn is a 57 y.o. male with: Moderate persistent asthma without complication Asthma as a child which was quiescent until Covid-19 infection in 2020. Recently prescribed Wixela but didn't start yet. Triggers include strong scents.  Today's skin prick testing showed: Negative to indoor/outdoor allergens. Today's spirometry showed: normal pattern with 4% improvement in FEV1 post bronchodilator treatment. Clinically feeling slightly improved.  Daily controller medication(s): start Wixela 1 puff twice a day and rinse mouth after each use as prescribed. Demonstrated proper use.  May use albuterol rescue inhaler 2 puffs every 4 to 6 hours as needed for shortness of breath, chest tightness, coughing, and wheezing. May use albuterol rescue inhaler 2 puffs 5 to 15 minutes prior to strenuous physical activities. Monitor frequency of use - if you need to use it more than twice per week on a consistent basis let us know.  Get spirometry at next visit.  Chronic rhinitis Perennial rhinitis symptoms which flare in the spring and fall. Takes Singulair and allegra with some benefit.  Today's skin prick testing showed: Negative to indoor/outdoor allergens. Get bloodwork to double check.   Continue Singulair (montelukast) 10mg  daily at night. Use Flonase (fluticasone) nasal spray 1 spray per nostril twice a day as needed for nasal congestion.  Nasal saline spray (i.e., Simply Saline) or nasal saline lavage (i.e., NeilMed) is recommended as needed and prior to medicated nasal sprays.  Other adverse food reactions, not elsewhere classified, subsequent encounter Garlic, onions, black pepper and avocado all cause GI  issues. Tuna flares GERD symptoms. Today's skin testing showed: Borderline to soy. Negative to other select foods.  Continue to avoid foods that are bothersome.   Hymenoptera allergy Patient's heart apparently stopped twice after a sting. Was on VIT with good benefit. Last sting only caused localized reactions. No recent work up. Continue to avoid.  Get bloodwork - if positive will send in Epipen.  For mild symptoms you can take over the counter antihistamines such as Benadryl and monitor symptoms closely. If symptoms worsen or if you have severe symptoms including breathing issues, throat closure, significant swelling, whole body hives, severe diarrhea and vomiting, lightheadedness then seek immediate medical care.  Rash and other nonspecific skin eruption Episode of rash/hive outbreak in May with no known triggers. Always feels itchy/rashy. Concerned about allergic triggers.  Today's skin prick testing showed: Negative to indoor/outdoor allergens. Borderline to soy.  Start zyrtec (cetirizine) 10mg  or allegra 180mg  once a day.  If symptoms are not controlled or causes drowsiness let us know. Avoid the following potential triggers: alcohol, tight clothing, NSAIDs, hot showers and getting overheated. See below for proper skin care.  Monitor symptoms and see if your more itchy/rash after eating soy products.  Get bloodwork.  Pruritus See assessment and plan as above.  Gastroesophageal reflux disease See handout for lifestyle and dietary modifications. Continue omeprazole 40mg  once day - nothing to eat or drink for 20-30 minutes afterwards.   Return in about 2 months (around 02/21/2023).  No orders of the defined types were placed in this encounter.  Lab Orders         Allergens w/Total IgE Area 2         Alpha-Gal Panel         ANA w/Reflex         C3 and C4         CBC with Differential/Platelet         Chronic Urticaria         Comprehensive metabolic panel         C-reactive  protein         Tryptase         Sedimentation rate         Thyroid Cascade Profile         Allergen Hymenoptera Panel         Soybean IgE      Other allergy screening: Food allergy:  sometimes eating tuna late in the day flares his GERD. Garlic and onions cause some GI issues. Black pepper causes GI issues.  Avocado causes abdominal pains.  Medication allergy: yes Hymenoptera allergy: yes Patient was on VIT for many years with good benefit.  Patient states his heart stopped twice after a sting.  Patient has been stung since then with only localized reactions.   Urticaria: yes Had an episode of hive outbreak in May which resolved with steroids. Not sure of exact trigger.  Eczema: on scalp, describes it as painful itching.  History of recurrent infections suggestive of immunodeficency: no  Diagnostics: Spirometry:  Tracings reviewed. His effort: Good reproducible efforts. FVC: 4.02L FEV1: 3.10L, 93% predicted FEV1/FVC ratio: 77% Interpretation: Spirometry consistent with normal pattern with 4% improvement in FEV1 post bronchodilator treatment. Clinically feeling slightly improved.   Please see scanned spirometry results for details.  Skin Testing: Environmental allergy panel and select foods. Negative to indoor/outdoor allergens. Borderline  to soy.  Results discussed with patient/family.  Airborne Adult Perc - 12/22/22 1420     Time Antigen Placed 1420    Allergen Manufacturer Waynette Buttery    Location Back    Number of Test 55    1. Control-Buffer 50% Glycerol Negative    2. Control-Histamine 3+    3. Bahia Negative    4. French Southern Territories Negative    5. Johnson Negative    6. Kentucky Blue Negative    7. Meadow Fescue Negative    8. Perennial Rye Negative    9. Timothy Negative    10. Ragweed Mix Negative    11. Cocklebur Negative    12. Plantain,  English Negative    13. Baccharis Negative    14. Dog Fennel Negative    15. Russian Thistle Negative    16. Lamb's Quarters  Negative    17. Sheep Sorrell Negative    18. Rough Pigweed Negative    19. Marsh Elder, Rough Negative    20. Mugwort, Common Negative    21. Box, Elder Negative    22. Cedar, red Negative    23. Sweet Gum Negative    24. Pecan Pollen Negative    25. Pine Mix Negative    26. Walnut, Black Pollen Negative    27. Red Mulberry Negative    28. Ash Mix Negative    29. Birch Mix Negative    30. Beech American Negative    31. Cottonwood, Guinea-Bissau Negative    32. Hickory, White Negative    33. Maple Mix Negative    34. Oak, Guinea-Bissau Mix Negative    35. Sycamore Eastern Negative    36. Alternaria Alternata Negative    37. Cladosporium Herbarum Negative    38. Aspergillus Mix Negative    39. Penicillium Mix Negative    40. Bipolaris Sorokiniana (Helminthosporium) Negative    41. Drechslera Spicifera (Curvularia) Negative    42. Mucor Plumbeus Negative    43. Fusarium Moniliforme Negative    44. Aureobasidium Pullulans (pullulara) Negative    45. Rhizopus Oryzae Negative    46. Botrytis Cinera Negative    47. Epicoccum Nigrum Negative    48. Phoma Betae Negative    49. Dust Mite Mix Negative    50. Cat Hair 10,000 BAU/ml Negative    51.  Dog Epithelia Negative    52. Mixed Feathers Negative    53. Horse Epithelia Negative    54. Cockroach, German Negative    55. Tobacco Leaf Negative             Food Adult Perc - 12/22/22 1400     Time Antigen Placed 1420    Allergen Manufacturer Waynette Buttery    Location Back    Number of allergen test 14    1. Peanut Negative    2. Soybean Negative    3. Wheat Negative    4. Sesame Negative    5. Milk, Cow Negative    6. Casein Negative    7. Egg White, Chicken Negative    8. Shellfish Mix Negative    9. Fish Mix Negative    19. Tuna Negative    47. Onion Negative    48. Avocado Negative    70. Garlic Negative    71. Pepper, Black Negative             Past Medical History: Patient Active Problem List   Diagnosis Date Noted    Moderate persistent asthma without complication 12/22/2022  Chronic rhinitis 12/22/2022   Other adverse food reactions, not elsewhere classified, subsequent encounter 12/22/2022   Gastroesophageal reflux disease 12/22/2022   Rash and other nonspecific skin eruption 12/22/2022   Hymenoptera allergy 12/22/2022   Pruritus 12/22/2022   BPH with obstruction/lower urinary tract symptoms 08/19/2021   Seizure-like activity (HCC) 06/05/2019   Chronic migraine 06/05/2019   Exertional dyspnea 10/05/2016   Past Medical History:  Diagnosis Date   Asthma    Chest pain at rest    Constipation    Depression    Exertional dyspnea    Gout attack    H/O degenerative disc disease    HLD (hyperlipidemia)    HTN (hypertension)    Hx of scarlet fever    childhood - seizures   Hypertriglyceridemia    IBS (irritable bowel syndrome)    Migraine    Obesity    Pseudoseizure    Schizoaffective disorder (HCC)    Past Surgical History: Past Surgical History:  Procedure Laterality Date   TRANSURETHRAL RESECTION OF PROSTATE N/A 08/19/2021   Procedure: TRANSURETHRAL RESECTION OF THE PROSTATE (TURP);  Surgeon: Noel Christmas, MD;  [facility];  Service: Urology;  Laterality: N/A;   UMBILICAL HERNIA REPAIR     Medication List:  Current Outpatient Medications  Medication Sig Dispense Refill   albuterol (VENTOLIN HFA) 108 (90 Base) MCG/ACT inhaler INHALE 2 PUFFS INTO THE LUNGS EVERY 6 HOURS AS NEEDED FOR 30 DAYS     ALBUTEROL IN Inhale 1 puff into the lungs as needed.     alfuzosin (UROXATRAL) 10 MG 24 hr tablet Take 10 mg by mouth daily.     clonazePAM (KLONOPIN) 0.5 MG tablet      finasteride (PROSCAR) 5 MG tablet Take 1 tablet by mouth daily.     hydrOXYzine (ATARAX) 10 MG tablet Take 10 mg by mouth at bedtime.     lisinopril-hydrochlorothiazide (PRINZIDE,ZESTORETIC) 20-12.5 MG tablet Take by mouth daily.      montelukast (SINGULAIR) 10 MG tablet      omeprazole (PRILOSEC)  40 MG capsule TAKE 1 CAPSULE BY MOUTH EVERYDAY AT BEDTIME     simvastatin (ZOCOR) 20 MG tablet Take by mouth daily.      traZODone (DESYREL) 100 MG tablet Take 100 mg by mouth daily.      venlafaxine XR (EFFEXOR-XR) 37.5 MG 24 hr capsule Take 37.5 mg by mouth daily with breakfast.     venlafaxine XR (EFFEXOR-XR) 75 MG 24 hr capsule Take 150 mg by mouth daily.     Cholecalciferol (VITAMIN D3 PO) Take 1.25 mcg by mouth daily. (Patient not taking: Reported on 12/22/2022)     cyclobenzaprine (FLEXERIL) 10 MG tablet Take 10 mg by mouth as needed for muscle spasms. (Patient not taking: Reported on 12/22/2022)     hydrOXYzine (ATARAX) 25 MG tablet Take 25 mg by mouth daily. (Patient not taking: Reported on 12/22/2022)     naproxen (NAPROSYN) 500 MG tablet Take 500 mg by mouth as needed.  (Patient not taking: Reported on 12/22/2022)     WIXELA INHUB 250-50 MCG/ACT AEPB Inhale 2 puffs into the lungs 2 (two) times daily. (Patient not taking: Reported on 12/22/2022)     No current facility-administered medications for this visit.   Allergies: Allergies  Allergen Reactions   Bee Venom     He has been through a series of injections that have lessened his reaction to a sting.   Hydrocodone     Rash    Lamictal [  Lamotrigine] Rash   Social History: Social History   Socioeconomic History   Marital status: Married    Spouse name: Not on file   Number of children: 2   Years of education: college   Highest education level: Associate degree: academic program  Occupational History   Occupation: Disabled  Tobacco Use   Smoking status: Former    Types: Cigarettes    Quit date: 1989    Years since quitting: 35.5   Smokeless tobacco: Former  Building services engineer Use: Never used  Substance and Sexual Activity   Alcohol use: Yes    Comment: 2-3 times per year   Drug use: No   Sexual activity: Not Currently  Other Topics Concern   Not on file  Social History Narrative   Lives at home with his wife.    Right-handed.   Caffeine use: 100-150mg  per day.   Social Determinants of Health   Financial Resource Strain: Not on file  Food Insecurity: Not on file  Transportation Needs: Not on file  Physical Activity: Not on file  Stress: Not on file  Social Connections: Not on file   Lives in a trailer. Smoking: denies, quit 34 years ago Occupation: disabled  Environmental History: Immunologist in the house: no Carpet in the family room: no Carpet in the bedroom: no Heating: heat pump Cooling: heat pump Pet: no  Family History: Family History  Problem Relation Age of Onset   CAD Mother 24       CABG x 4   Food Allergy Mother    Stroke Father    Atrial fibrillation Father    Seizures Sister    Allergies Maternal Grandmother    Food Allergy Daughter    Food Allergy Grandson    Review of Systems  Constitutional:  Negative for appetite change, chills, fever and unexpected weight change.  HENT:  Positive for congestion and sneezing. Negative for rhinorrhea.   Eyes:  Negative for itching.  Respiratory:  Positive for cough, chest tightness, shortness of breath and wheezing.   Cardiovascular:  Negative for chest pain.  Gastrointestinal:  Negative for abdominal pain.  Genitourinary:  Negative for difficulty urinating.  Skin:  Positive for rash.       itching  Neurological:  Negative for headaches.    Objective: BP 118/74   Pulse 76   Temp 97.9 F (36.6 C)   Resp 16   Ht 5\' 7"  (1.702 m)   Wt 242 lb 4 oz (109.9 kg)   SpO2 95%   BMI 37.94 kg/m  Body mass index is 37.94 kg/m. Physical Exam Vitals and nursing note reviewed.  Constitutional:      Appearance: Normal appearance. He is well-developed.  HENT:     Head: Normocephalic and atraumatic.     Right Ear: Tympanic membrane and external ear normal.     Left Ear: Tympanic membrane and external ear normal.     Nose: Nose normal.     Mouth/Throat:     Mouth: Mucous membranes are moist.     Pharynx: Oropharynx  is clear.  Eyes:     Conjunctiva/sclera: Conjunctivae normal.  Cardiovascular:     Rate and Rhythm: Normal rate and regular rhythm.     Heart sounds: Normal heart sounds. No murmur heard.    No friction rub. No gallop.  Pulmonary:     Effort: Pulmonary effort is normal.     Breath sounds: Normal breath sounds. No wheezing, rhonchi or rales.  Musculoskeletal:     Cervical back: Neck supple.  Skin:    General: Skin is warm.     Findings: No rash.  Neurological:     Mental Status: He is alert and oriented to person, place, and time.  Psychiatric:        Behavior: Behavior normal.   The plan was reviewed with the patient/family, and all questions/concerned were addressed.  It was my pleasure to see Carl Flynn today and participate in his care. Please feel free to contact me with any questions or concerns.  Sincerely,  Wyline Mood, DO Allergy & Immunology  [facility] office: [phone] [facility] office: [phone]

## 2022-12-22 NOTE — Patient Instructions (Addendum)
Today's skin testing showed: Negative to indoor/outdoor allergens. Borderline to soy.   Results given.  Breathing Daily controller medication(s): start Wixela 1 puff twice a day and rinse mouth after each use as prescribed.  May use albuterol rescue inhaler 2 puffs every 4 to 6 hours as needed for shortness of breath, chest tightness, coughing, and wheezing. May use albuterol rescue inhaler 2 puffs 5 to 15 minutes prior to strenuous physical activities. Monitor frequency of use - if you need to use it more than twice per week on a consistent basis let us know.  Breathing control goals:  Full participation in all desired activities (may need albuterol before activity) Albuterol use two times or less a week on average (not counting use with activity) Cough interfering with sleep two times or less a month Oral steroids no more than once a year No hospitalizations   Rhinitis  Continue Singulair (montelukast) 10mg  daily at night. Use Flonase (fluticasone) nasal spray 1 spray per nostril twice a day as needed for nasal congestion.  Nasal saline spray (i.e., Simply Saline) or nasal saline lavage (i.e., NeilMed) is recommended as needed and prior to medicated nasal sprays.  Reflux See handout for lifestyle and dietary modifications. Continue omeprazole 40mg  once day - nothing to eat or drink for 20-30 minutes afterwards.   Food Continue to avoid foods that are bothersome.   Bee sting allergy Continue to avoid.  Get bloodwork.  For mild symptoms you can take over the counter antihistamines such as Benadryl and monitor symptoms closely. If symptoms worsen or if you have severe symptoms including breathing issues, throat closure, significant swelling, whole body hives, severe diarrhea and vomiting, lightheadedness then seek immediate medical care.  Rash Start zyrtec (cetirizine) 10mg  or allegra 180mg  once a day.  If symptoms are not controlled or causes drowsiness let us know. Avoid  the following potential triggers: alcohol, tight clothing, NSAIDs, hot showers and getting overheated. See below for proper skin care.  Monitor symptoms and see if your more itchy/rash after eating soy products.   Get bloodwork:  We are ordering labs, so please allow 1-2 weeks for the results to come back. With the newly implemented Cures Act, the labs might be visible to you at the same time that they become visible to me. However, I will not address the results until all of the results are back, so please be patient.   Follow up in 2 months or sooner if needed.  Skin care recommendations  Bath time: Always use lukewarm water. AVOID very hot or cold water. Keep bathing time to 5-10 minutes. Do NOT use bubble bath. Use a mild soap and use just enough to wash the dirty areas. Do NOT scrub skin vigorously.  After bathing, pat dry your skin with a towel. Do NOT rub or scrub the skin.  Moisturizers and prescriptions:  ALWAYS apply moisturizers immediately after bathing (within 3 minutes). This helps to lock-in moisture. Use the moisturizer several times a day over the whole body. Good summer moisturizers include: Aveeno, CeraVe, Cetaphil. Good winter moisturizers include: Aquaphor, Vaseline, Cerave, Cetaphil, Eucerin, Vanicream. When using moisturizers along with medications, the moisturizer should be applied about one hour after applying the medication to prevent diluting effect of the medication or moisturize around where you applied the medications. When not using medications, the moisturizer can be continued twice daily as maintenance.  Laundry and clothing: Avoid laundry products with added color or perfumes. Use unscented hypo-allergenic laundry products such as Tide free,  Cheer free & gentle, and All free and clear.  If the skin still seems dry or sensitive, you can try double-rinsing the clothes. Avoid tight or scratchy clothing such as wool. Do not use fabric softeners or dyer  sheets.

## 2022-12-22 NOTE — Assessment & Plan Note (Signed)
.   See assessment and plan as above. 

## 2022-12-22 NOTE — Assessment & Plan Note (Signed)
Episode of rash/hive outbreak in May with no known triggers. Always feels itchy/rashy. Concerned about allergic triggers.  Today's skin prick testing showed: Negative to indoor/outdoor allergens. Borderline to soy.  Start zyrtec (cetirizine) 10mg  or allegra 180mg  once a day.  If symptoms are not controlled or causes drowsiness let us know. Avoid the following potential triggers: alcohol, tight clothing, NSAIDs, hot showers and getting overheated. See below for proper skin care.  Monitor symptoms and see if your more itchy/rash after eating soy products.  Get bloodwork.

## 2022-12-23 DIAGNOSIS — G4733 Obstructive sleep apnea (adult) (pediatric): Secondary | ICD-10-CM | POA: Diagnosis not present

## 2022-12-30 DIAGNOSIS — F4312 Post-traumatic stress disorder, chronic: Secondary | ICD-10-CM | POA: Diagnosis not present

## 2022-12-30 DIAGNOSIS — F4541 Pain disorder exclusively related to psychological factors: Secondary | ICD-10-CM | POA: Diagnosis not present

## 2022-12-30 DIAGNOSIS — F331 Major depressive disorder, recurrent, moderate: Secondary | ICD-10-CM | POA: Diagnosis not present

## 2023-01-22 DIAGNOSIS — G4733 Obstructive sleep apnea (adult) (pediatric): Secondary | ICD-10-CM | POA: Diagnosis not present

## 2023-02-22 DIAGNOSIS — G4733 Obstructive sleep apnea (adult) (pediatric): Secondary | ICD-10-CM | POA: Diagnosis not present

## 2023-02-23 ENCOUNTER — Ambulatory Visit: Payer: Medicare HMO | Admitting: Allergy

## 2023-02-24 DIAGNOSIS — F331 Major depressive disorder, recurrent, moderate: Secondary | ICD-10-CM | POA: Diagnosis not present

## 2023-02-24 DIAGNOSIS — F4312 Post-traumatic stress disorder, chronic: Secondary | ICD-10-CM | POA: Diagnosis not present

## 2023-02-24 DIAGNOSIS — F4541 Pain disorder exclusively related to psychological factors: Secondary | ICD-10-CM | POA: Diagnosis not present

## 2023-03-11 DIAGNOSIS — M9905 Segmental and somatic dysfunction of pelvic region: Secondary | ICD-10-CM | POA: Diagnosis not present

## 2023-03-11 DIAGNOSIS — M5432 Sciatica, left side: Secondary | ICD-10-CM | POA: Diagnosis not present

## 2023-03-11 DIAGNOSIS — M531 Cervicobrachial syndrome: Secondary | ICD-10-CM | POA: Diagnosis not present

## 2023-03-11 DIAGNOSIS — M9903 Segmental and somatic dysfunction of lumbar region: Secondary | ICD-10-CM | POA: Diagnosis not present

## 2023-03-11 DIAGNOSIS — M9901 Segmental and somatic dysfunction of cervical region: Secondary | ICD-10-CM | POA: Diagnosis not present

## 2023-03-25 DIAGNOSIS — G4733 Obstructive sleep apnea (adult) (pediatric): Secondary | ICD-10-CM | POA: Diagnosis not present

## 2023-04-21 DIAGNOSIS — F4312 Post-traumatic stress disorder, chronic: Secondary | ICD-10-CM | POA: Diagnosis not present

## 2023-04-21 DIAGNOSIS — F4541 Pain disorder exclusively related to psychological factors: Secondary | ICD-10-CM | POA: Diagnosis not present

## 2023-04-21 DIAGNOSIS — F331 Major depressive disorder, recurrent, moderate: Secondary | ICD-10-CM | POA: Diagnosis not present

## 2023-04-24 DIAGNOSIS — G4733 Obstructive sleep apnea (adult) (pediatric): Secondary | ICD-10-CM | POA: Diagnosis not present

## 2023-05-06 DIAGNOSIS — F4312 Post-traumatic stress disorder, chronic: Secondary | ICD-10-CM | POA: Diagnosis not present

## 2023-05-06 DIAGNOSIS — F4541 Pain disorder exclusively related to psychological factors: Secondary | ICD-10-CM | POA: Diagnosis not present

## 2023-05-06 DIAGNOSIS — F331 Major depressive disorder, recurrent, moderate: Secondary | ICD-10-CM | POA: Diagnosis not present

## 2023-05-20 DIAGNOSIS — F4541 Pain disorder exclusively related to psychological factors: Secondary | ICD-10-CM | POA: Diagnosis not present

## 2023-05-20 DIAGNOSIS — F331 Major depressive disorder, recurrent, moderate: Secondary | ICD-10-CM | POA: Diagnosis not present

## 2023-05-20 DIAGNOSIS — F4312 Post-traumatic stress disorder, chronic: Secondary | ICD-10-CM | POA: Diagnosis not present

## 2023-05-25 DIAGNOSIS — G4733 Obstructive sleep apnea (adult) (pediatric): Secondary | ICD-10-CM | POA: Diagnosis not present

## 2023-06-04 DIAGNOSIS — G4733 Obstructive sleep apnea (adult) (pediatric): Secondary | ICD-10-CM | POA: Diagnosis not present

## 2023-06-24 DIAGNOSIS — R109 Unspecified abdominal pain: Secondary | ICD-10-CM | POA: Diagnosis not present

## 2023-06-24 DIAGNOSIS — G4733 Obstructive sleep apnea (adult) (pediatric): Secondary | ICD-10-CM | POA: Diagnosis not present

## 2023-07-14 ENCOUNTER — Other Ambulatory Visit: Payer: Self-pay | Admitting: Family Medicine

## 2023-07-14 DIAGNOSIS — R109 Unspecified abdominal pain: Secondary | ICD-10-CM

## 2023-07-15 DIAGNOSIS — F331 Major depressive disorder, recurrent, moderate: Secondary | ICD-10-CM | POA: Diagnosis not present

## 2023-07-15 DIAGNOSIS — F4541 Pain disorder exclusively related to psychological factors: Secondary | ICD-10-CM | POA: Diagnosis not present

## 2023-07-15 DIAGNOSIS — F4312 Post-traumatic stress disorder, chronic: Secondary | ICD-10-CM | POA: Diagnosis not present

## 2023-07-19 ENCOUNTER — Ambulatory Visit: Payer: Medicare HMO

## 2023-07-19 DIAGNOSIS — K7689 Other specified diseases of liver: Secondary | ICD-10-CM | POA: Diagnosis not present

## 2023-07-19 DIAGNOSIS — R932 Abnormal findings on diagnostic imaging of liver and biliary tract: Secondary | ICD-10-CM | POA: Diagnosis not present

## 2023-07-19 DIAGNOSIS — K769 Liver disease, unspecified: Secondary | ICD-10-CM | POA: Diagnosis not present

## 2023-07-19 DIAGNOSIS — C61 Malignant neoplasm of prostate: Secondary | ICD-10-CM | POA: Diagnosis not present

## 2023-07-19 DIAGNOSIS — R109 Unspecified abdominal pain: Secondary | ICD-10-CM | POA: Diagnosis not present

## 2023-07-19 MED ORDER — IOHEXOL 300 MG/ML  SOLN
100.0000 mL | Freq: Once | INTRAMUSCULAR | Status: AC | PRN
Start: 2023-07-19 — End: 2023-07-19
  Administered 2023-07-19: 100 mL via INTRAVENOUS

## 2023-07-19 MED ORDER — IOHEXOL 9 MG/ML PO SOLN
500.0000 mL | ORAL | Status: AC
Start: 2023-07-19 — End: 2023-07-19
  Administered 2023-07-19: 500 mL via ORAL

## 2023-07-25 DIAGNOSIS — G4733 Obstructive sleep apnea (adult) (pediatric): Secondary | ICD-10-CM | POA: Diagnosis not present

## 2023-08-02 ENCOUNTER — Other Ambulatory Visit: Payer: Self-pay | Admitting: Family Medicine

## 2023-08-02 DIAGNOSIS — K769 Liver disease, unspecified: Secondary | ICD-10-CM

## 2023-08-09 ENCOUNTER — Ambulatory Visit: Payer: Medicare HMO

## 2023-08-09 DIAGNOSIS — K769 Liver disease, unspecified: Secondary | ICD-10-CM

## 2023-08-09 DIAGNOSIS — K760 Fatty (change of) liver, not elsewhere classified: Secondary | ICD-10-CM | POA: Diagnosis not present

## 2023-08-09 MED ORDER — GADOBUTROL 1 MMOL/ML IV SOLN
10.0000 mL | Freq: Once | INTRAVENOUS | Status: AC | PRN
Start: 2023-08-09 — End: 2023-08-09
  Administered 2023-08-09: 10 mL via INTRAVENOUS

## 2023-09-22 DIAGNOSIS — W57XXXA Bitten or stung by nonvenomous insect and other nonvenomous arthropods, initial encounter: Secondary | ICD-10-CM | POA: Diagnosis not present

## 2023-09-22 DIAGNOSIS — S30860A Insect bite (nonvenomous) of lower back and pelvis, initial encounter: Secondary | ICD-10-CM | POA: Diagnosis not present

## 2023-10-07 DIAGNOSIS — F4541 Pain disorder exclusively related to psychological factors: Secondary | ICD-10-CM | POA: Diagnosis not present

## 2023-10-07 DIAGNOSIS — F331 Major depressive disorder, recurrent, moderate: Secondary | ICD-10-CM | POA: Diagnosis not present

## 2023-10-07 DIAGNOSIS — F4312 Post-traumatic stress disorder, chronic: Secondary | ICD-10-CM | POA: Diagnosis not present

## 2023-12-03 DIAGNOSIS — Z6832 Body mass index (BMI) 32.0-32.9, adult: Secondary | ICD-10-CM | POA: Diagnosis not present

## 2023-12-03 DIAGNOSIS — S2002XA Contusion of left breast, initial encounter: Secondary | ICD-10-CM | POA: Diagnosis not present

## 2023-12-21 DIAGNOSIS — Z125 Encounter for screening for malignant neoplasm of prostate: Secondary | ICD-10-CM | POA: Diagnosis not present

## 2023-12-21 DIAGNOSIS — E785 Hyperlipidemia, unspecified: Secondary | ICD-10-CM | POA: Diagnosis not present

## 2023-12-21 DIAGNOSIS — I10 Essential (primary) hypertension: Secondary | ICD-10-CM | POA: Diagnosis not present

## 2024-02-08 DIAGNOSIS — N6489 Other specified disorders of breast: Secondary | ICD-10-CM | POA: Diagnosis not present

## 2024-02-08 DIAGNOSIS — Z6832 Body mass index (BMI) 32.0-32.9, adult: Secondary | ICD-10-CM | POA: Diagnosis not present

## 2024-04-10 DIAGNOSIS — T1490XA Injury, unspecified, initial encounter: Secondary | ICD-10-CM | POA: Diagnosis not present

## 2024-04-10 DIAGNOSIS — N6459 Other signs and symptoms in breast: Secondary | ICD-10-CM | POA: Diagnosis not present

## 2024-04-10 DIAGNOSIS — N6320 Unspecified lump in the left breast, unspecified quadrant: Secondary | ICD-10-CM | POA: Diagnosis not present

## 2024-04-10 DIAGNOSIS — Z043 Encounter for examination and observation following other accident: Secondary | ICD-10-CM | POA: Diagnosis not present

## 2024-04-19 DIAGNOSIS — N6459 Other signs and symptoms in breast: Secondary | ICD-10-CM | POA: Diagnosis not present

## 2024-04-19 DIAGNOSIS — N6342 Unspecified lump in left breast, subareolar: Secondary | ICD-10-CM | POA: Diagnosis not present

## 2024-04-19 DIAGNOSIS — S299XXS Unspecified injury of thorax, sequela: Secondary | ICD-10-CM | POA: Diagnosis not present

## 2024-05-01 DIAGNOSIS — F4312 Post-traumatic stress disorder, chronic: Secondary | ICD-10-CM | POA: Diagnosis not present

## 2024-05-01 DIAGNOSIS — F331 Major depressive disorder, recurrent, moderate: Secondary | ICD-10-CM | POA: Diagnosis not present

## 2024-05-01 DIAGNOSIS — F4541 Pain disorder exclusively related to psychological factors: Secondary | ICD-10-CM | POA: Diagnosis not present

## 2024-05-17 DIAGNOSIS — R92322 Mammographic fibroglandular density, left breast: Secondary | ICD-10-CM | POA: Diagnosis not present

## 2024-05-17 DIAGNOSIS — N6342 Unspecified lump in left breast, subareolar: Secondary | ICD-10-CM | POA: Diagnosis not present

## 2024-06-09 DIAGNOSIS — C50022 Malignant neoplasm of nipple and areola, left male breast: Secondary | ICD-10-CM | POA: Diagnosis not present

## 2024-06-09 DIAGNOSIS — C50522 Malignant neoplasm of lower-outer quadrant of left male breast: Secondary | ICD-10-CM | POA: Diagnosis not present

## 2024-06-09 DIAGNOSIS — N6342 Unspecified lump in left breast, subareolar: Secondary | ICD-10-CM | POA: Diagnosis not present

## 2024-06-16 DIAGNOSIS — Z170 Estrogen receptor positive status [ER+]: Secondary | ICD-10-CM | POA: Diagnosis not present

## 2024-06-16 DIAGNOSIS — C50822 Malignant neoplasm of overlapping sites of left male breast: Secondary | ICD-10-CM | POA: Diagnosis not present

## 2024-08-01 NOTE — Progress Notes (Signed)
 Carl Flynn                                          MRN: [id number]   08/01/2024   The [facility] Quality Team Specialist reviewed this patient medical record for the purposes of chart review for care gap closure. The following were reviewed: chart review for care gap closure-controlling blood pressure.    [facility] Quality Team
